# Patient Record
Sex: Male | Born: 1955
Health system: Southern US, Community
[De-identification: ages and names within clinical notes are randomized; demographics above are authoritative.]

## PROBLEM LIST (undated history)

## (undated) DIAGNOSIS — J670 Farmer's lung: Secondary | ICD-10-CM

## (undated) DIAGNOSIS — I10 Essential (primary) hypertension: Secondary | ICD-10-CM

## (undated) DIAGNOSIS — T7840XA Allergy, unspecified, initial encounter: Secondary | ICD-10-CM

## (undated) DIAGNOSIS — K219 Gastro-esophageal reflux disease without esophagitis: Secondary | ICD-10-CM

## (undated) HISTORY — PX: FRACTURE SURGERY: SHX138

## (undated) HISTORY — PX: CHOLECYSTECTOMY: SHX55

## (undated) HISTORY — PX: SPLENECTOMY: SUR1306

## (undated) HISTORY — PX: HERNIA REPAIR: SHX51

## (undated) HISTORY — PX: TONSILLECTOMY: SUR1361

## (undated) HISTORY — DX: Allergy, unspecified, initial encounter: T78.40XA

## (undated) HISTORY — DX: Farmer's lung: J67.0

## (undated) HISTORY — DX: Essential (primary) hypertension: I10

---

## 2017-04-30 LAB — HM COLONOSCOPY

## 2020-11-28 DIAGNOSIS — K219 Gastro-esophageal reflux disease without esophagitis: Secondary | ICD-10-CM | POA: Diagnosis not present

## 2020-11-28 DIAGNOSIS — N529 Male erectile dysfunction, unspecified: Secondary | ICD-10-CM | POA: Diagnosis not present

## 2020-11-28 DIAGNOSIS — Z87891 Personal history of nicotine dependence: Secondary | ICD-10-CM | POA: Diagnosis not present

## 2020-11-28 DIAGNOSIS — Z809 Family history of malignant neoplasm, unspecified: Secondary | ICD-10-CM | POA: Diagnosis not present

## 2020-11-28 DIAGNOSIS — Z823 Family history of stroke: Secondary | ICD-10-CM | POA: Diagnosis not present

## 2020-11-28 DIAGNOSIS — Z8249 Family history of ischemic heart disease and other diseases of the circulatory system: Secondary | ICD-10-CM | POA: Diagnosis not present

## 2020-11-28 DIAGNOSIS — R030 Elevated blood-pressure reading, without diagnosis of hypertension: Secondary | ICD-10-CM | POA: Diagnosis not present

## 2021-04-10 ENCOUNTER — Other Ambulatory Visit: Payer: Self-pay

## 2021-04-10 ENCOUNTER — Ambulatory Visit (INDEPENDENT_AMBULATORY_CARE_PROVIDER_SITE_OTHER): Payer: Medicare HMO | Admitting: Nurse Practitioner

## 2021-04-10 ENCOUNTER — Encounter: Payer: Self-pay | Admitting: Nurse Practitioner

## 2021-04-10 VITALS — BP 150/101 | HR 92 | Temp 98.2°F | Ht 68.0 in | Wt 181.0 lb

## 2021-04-10 DIAGNOSIS — Z9109 Other allergy status, other than to drugs and biological substances: Secondary | ICD-10-CM

## 2021-04-10 DIAGNOSIS — Z91030 Bee allergy status: Secondary | ICD-10-CM | POA: Diagnosis not present

## 2021-04-10 MED ORDER — ALBUTEROL SULFATE HFA 108 (90 BASE) MCG/ACT IN AERS: 2.0000 | INHALATION_SPRAY | RESPIRATORY_TRACT | 3 refills | Status: DC | PRN

## 2021-04-10 MED ORDER — EPINEPHRINE 0.3 MG/0.3ML IJ SOAJ: 0.3000 mg | INTRAMUSCULAR | 1 refills | Status: DC | PRN

## 2021-04-10 NOTE — Assessment & Plan Note (Signed)
-  occurred several days ago; no acute issues today -Rx. Epipen; discussed how to use it and to go to ED for eval if he has to use this

## 2021-04-10 NOTE — Progress Notes (Signed)
Acute Office Visit  Subjective:    Patient ID: Kyle Jarvis, male    DOB: 05-26-1956, 65 y.o.   MRN: [id number]  Chief Complaint  Patient presents with   Allergic Reaction    Was stung by yellow jackets x1 week ago, body started swelling all over and broke out in hives, also had shortness of breath. Would like epi-pen and needs inhaler refilled.    Allergic Reaction  Patient is in today for allergy.  He has allergies that cause throat swelling. He has allergies to fermented things like wine, animal feeds, etc. He has bene using an inhaler for that, but needs a refill.  He also had recent anaphylactic rxn to been venom, and would like epipen.  Past Medical History:  Diagnosis Date   Allergy    Hypertension     Past Surgical History:  Procedure Laterality Date   CHOLECYSTECTOMY     FRACTURE SURGERY Right    hand surgery- in [place]      History reviewed. No pertinent family history.  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   Social Determinants of Health   Financial Resource Strain: Not on file  Food Insecurity: Not on file  Transportation Needs: Not on file  Physical Activity: Not on file  Stress: Not on file  Social Connections: Not on file  Intimate Partner  Violence: Not on file    Outpatient Medications Prior to Visit  Medication Sig Dispense Refill   albuterol (VENTOLIN HFA) 108 (90 Base) MCG/ACT inhaler Inhale 2 puffs into the lungs every 4 (four) hours as needed for wheezing or shortness of breath.     No facility-administered medications prior to visit.    Allergies  Allergen Reactions   Bee Venom Anaphylaxis   Hydrocortisone Swelling    Had back injection, and he had to be wheelchair bound for 3 months   Pollen Extract     Review of Systems  Constitutional: Negative.   Respiratory: Negative.    Cardiovascular: Negative.   Musculoskeletal: Negative.   Allergic/Immunologic: Positive for environmental allergies.  Psychiatric/Behavioral: Negative.        Objective:    Physical Exam Constitutional:      Appearance: Normal appearance.  Cardiovascular:     Rate and Rhythm: Normal rate and regular rhythm.     Pulses: Normal pulses.     Heart sounds: Normal heart sounds.  Pulmonary:     Effort: Pulmonary effort is normal.     Breath sounds: Normal breath sounds.  Neurological:     Mental Status: He is alert.  Psychiatric:  Mood and Affect: Mood normal.        Behavior: Behavior normal.        Thought Content: Thought content normal.        Judgment: Judgment normal.    BP (!) 150/101 (BP Location: Left Arm, Patient Position: Sitting, Cuff Size: Large)   Pulse 92   Temp 98.2 F (36.8 C) (Temporal)   Ht '5\' 8"'  (1.727 m)   Wt 181 lb (82.1 kg)   SpO2 95%   BMI 27.52 kg/m  Wt Readings from Last 3 Encounters:  04/10/21 181 lb (82.1 kg)    Health Maintenance Due  Topic Date Due   Hepatitis C Screening  Never done   COLONOSCOPY (Pts 45-54yr Insurance coverage will need to be confirmed)  Never done   Zoster Vaccines- Shingrix (1 of 2) Never done   PNA vac Low Risk Adult (1 of 2 - PCV13) Never done   INFLUENZA VACCINE  04/01/2021    There are no preventive care reminders to display for this patient.   No  results found for: TSH No results found for: WBC, HGB, HCT, MCV, PLT No results found for: NA, K, CHLORIDE, CO2, GLUCOSE, BUN, CREATININE, BILITOT, ALKPHOS, AST, ALT, PROT, ALBUMIN, CALCIUM, ANIONGAP, EGFR, GFR No results found for: CHOL No results found for: HDL No results found for: LDLCALC No results found for: TRIG No results found for: CHOLHDL No results found for: HGBA1C     Assessment & Plan:   Problem List Items Addressed This Visit       Other   Allergy to bee sting - Primary    -occurred several days ago; no acute issues today -Rx. Epipen; discussed how to use it and to go to ED for eval if he has to use this       Environmental allergies    -has pollen allergies and allergies to "fermented things"- like animal feeds, wine, etc. -causes SOB, and he uses albuterol for this -Refilled albuterol       Relevant Medications   albuterol (VENTOLIN HFA) 108 (90 Base) MCG/ACT inhaler     Meds ordered this encounter  Medications   albuterol (VENTOLIN HFA) 108 (90 Base) MCG/ACT inhaler    Sig: Inhale 2 puffs into the lungs every 4 (four) hours as needed for wheezing or shortness of breath.    Dispense:  6.7 g    Refill:  3   EPINEPHrine 0.3 mg/0.3 mL IJ SOAJ injection    Sig: Inject 0.3 mg into the muscle as needed for anaphylaxis.    Dispense:  1 each    Refill:  1[place]quott NP

## 2021-04-10 NOTE — Assessment & Plan Note (Signed)
-  has pollen allergies and allergies to "fermented things"- like animal feeds, wine, etc. -causes SOB, and he uses albuterol for this -Refilled albuterol

## 2021-04-26 ENCOUNTER — Telehealth (INDEPENDENT_AMBULATORY_CARE_PROVIDER_SITE_OTHER): Payer: Medicare HMO | Admitting: Nurse Practitioner

## 2021-04-26 ENCOUNTER — Encounter: Payer: Self-pay | Admitting: Nurse Practitioner

## 2021-04-26 ENCOUNTER — Other Ambulatory Visit: Payer: Self-pay

## 2021-04-26 ENCOUNTER — Telehealth: Payer: Self-pay

## 2021-04-26 DIAGNOSIS — U071 COVID-19: Secondary | ICD-10-CM | POA: Insufficient documentation

## 2021-04-26 HISTORY — DX: COVID-19: U07.1

## 2021-04-26 MED ORDER — NOREL AD 4-10-325 MG PO TABS: 1.0000 | ORAL_TABLET | ORAL | 1 refills | Status: DC | PRN

## 2021-04-26 MED ORDER — NIRMATRELVIR/RITONAVIR (PAXLOVID)TABLET: 3.0000 | ORAL_TABLET | Freq: Two times a day (BID) | ORAL | 0 refills | Status: AC

## 2021-04-26 MED ORDER — HYDROCOD POLST-CPM POLST ER 10-8 MG/5ML PO SUER: 5.0000 mL | Freq: Two times a day (BID) | ORAL | 0 refills | Status: DC | PRN

## 2021-04-26 MED ORDER — PREDNISONE 20 MG PO TABS: 20.0000 mg | ORAL_TABLET | Freq: Every day | ORAL | 0 refills | Status: DC

## 2021-04-26 NOTE — Telephone Encounter (Signed)
Olegario Messier from [facility] called and is requesting to speak with someone about the medications sent in today it has severe reactions with other medications and she is needing a complete med list ph# [phone]

## 2021-04-26 NOTE — Progress Notes (Addendum)
Acute Office Visit  Subjective:    Patient ID: Kyle Jarvis, male    DOB: [birth year], 65 y.o.   MRN: [id number]  Chief Complaint  Patient presents with   Covid Positive    Tested positive on an at home test this morning. Symptoms started yesterday, sinus congestion, productive cough, and fever.    HPI Patient is in today for COVID-19. Symptoms per ROS. No OTCs at this point.   Past Medical History:  Diagnosis Date   Allergy    Hypertension     Past Surgical History:  Procedure Laterality Date   CHOLECYSTECTOMY     FRACTURE SURGERY Right    hand surgery- in [place]      History reviewed. No pertinent family history.  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   Social Determinants of Health   Financial Resource Strain: Not on file  Food Insecurity: Not on file  Transportation Needs: Not on file  Physical Activity: Not on file  Stress: Not on file  Social Connections: Not on file  Intimate Partner Violence: Not on file    Outpatient Medications Prior to Visit  Medication Sig Dispense Refill   albuterol (VENTOLIN HFA) 108 (90 Base) MCG/ACT inhaler Inhale 2 puffs into the lungs every 4 (four) hours as needed for wheezing or shortness of breath. 6.7 g 3   EPINEPHrine  0.3 mg/0.3 mL IJ SOAJ injection Inject 0.3 mg into the muscle as needed for anaphylaxis. 1 each 1   No facility-administered medications prior to visit.    Allergies  Allergen Reactions   Bee Venom Anaphylaxis   Hydrocortisone Swelling    Had back injection, and he had to be wheelchair bound for 3 months   Pollen Extract     Review of Systems  Constitutional:  Positive for fever.  HENT:  Positive for congestion.   Respiratory:  Positive for cough and shortness of breath.       Objective:    Physical Exam  Ht '5\' 8"'  (1.727 m)   Wt 180 lb (81.6 kg)   BMI 27.37 kg/m  Wt Readings from Last 3 Encounters:  04/26/21 180 lb (81.6 kg)  04/10/21 181 lb (82.1 kg)    Health Maintenance Due  Topic Date Due   Hepatitis C Screening  Never done   COLONOSCOPY (Pts 45-2yr Insurance coverage will need to be confirmed)  Never done   Zoster Vaccines- Shingrix (1 of 2) Never done   PNA vac Low Risk Adult (1 of 2 - PCV13) Never done   INFLUENZA VACCINE  04/01/2021    There are no preventive care reminders  to display for this patient.   No results found for: TSH No results found for: WBC, HGB, HCT, MCV, PLT No results found for: NA, K, CHLORIDE, CO2, GLUCOSE, BUN, CREATININE, BILITOT, ALKPHOS, AST, ALT, PROT, ALBUMIN, CALCIUM, ANIONGAP, EGFR, GFR No results found for: CHOL No results found for: HDL No results found for: LDLCALC No results found for: TRIG No results found for: CHOLHDL No results found for: HGBA1C     Assessment & Plan:   Problem List Items Addressed This Visit       Other   COVID-19    -he states he has hx of [facility] -Rx. Paxlovid -Rx prednisone burst pack; we discussed taking half of a tablet today as a test dose since he had allergy to hydrocortisone; if he has no issues, he can take a full dose tomorrow, and just save the half tablet until  -Rx. tussionex and norel      Relevant Medications   nirmatrelvir/ritonavir EUA (PAXLOVID) 20 x 150 MG & 10  x 100MG TABS     Meds ordered this encounter  Medications   chlorpheniramine-HYDROcodone (TUSSIONEX PENNKINETIC ER) 10-8 MG/5ML SUER    Sig: Take 5 mLs by mouth every 12 (twelve) hours as needed for cough.    Dispense:  115 mL    Refill:  0   predniSONE (DELTASONE) 20 MG tablet    Sig: Take 1 tablet (20 mg total) by mouth daily with breakfast.    Dispense:  10 tablet    Refill:  0   Chlorphen-PE-Acetaminophen (NOREL AD) 4-10-325 MG TABS    Sig: Take 1 tablet by mouth every 4 (four) hours as needed (nasal congestion, cold symptoms).    Dispense:  20 tablet    Refill:  1   nirmatrelvir/ritonavir EUA (PAXLOVID) 20 x 150 MG & 10 x 100MG TABS    Sig: Take 3 tablets by mouth 2 (two) times daily for 5 days. (Take nirmatrelvir 150 mg two tablets twice daily for 5 days and ritonavir 100 mg one tablet twice daily for 5 days) Patient GFR is unknown.    Dispense:  30 tablet    Refill:  0   Date:  04/26/2021   Location of Patient: Home Location of Provider: Office Consent was obtain for visit to be over via telehealth. I verified that I am speaking with the correct person using two identifiers.  I connected with  Kyle Jarvis on 04/26/21 via telephone and verified that I am speaking with the correct person using two identifiers.   I discussed the limitations of evaluation and management by telemedicine. The patient expressed understanding and agreed to proceed.  Time spent: 7 minutes   Noreene Larsson, NP

## 2021-04-26 NOTE — Assessment & Plan Note (Addendum)
-  he states he has hx of [facility] Lung -Rx. Paxlovid -Rx prednisone burst pack; we discussed taking half of a tablet today as a test dose since he had allergy to hydrocortisone; if he has no issues, he can take a full dose tomorrow, and just save the half tablet until  -Rx. tussionex and norel

## 2021-04-26 NOTE — Telephone Encounter (Signed)
Spoke with Olegario Messier, she needed a med list faxed over. Faxed.

## 2021-05-15 ENCOUNTER — Ambulatory Visit (INDEPENDENT_AMBULATORY_CARE_PROVIDER_SITE_OTHER): Payer: Medicare HMO | Admitting: Nurse Practitioner

## 2021-05-15 ENCOUNTER — Encounter: Payer: Self-pay | Admitting: Nurse Practitioner

## 2021-05-15 ENCOUNTER — Other Ambulatory Visit: Payer: Self-pay

## 2021-05-15 VITALS — BP 149/99 | HR 94 | Temp 98.1°F | Ht 68.0 in | Wt 178.0 lb

## 2021-05-15 DIAGNOSIS — N5239 Other post-surgical erectile dysfunction: Secondary | ICD-10-CM | POA: Diagnosis not present

## 2021-05-15 DIAGNOSIS — Z7689 Persons encountering health services in other specified circumstances: Secondary | ICD-10-CM | POA: Diagnosis not present

## 2021-05-15 DIAGNOSIS — Z139 Encounter for screening, unspecified: Secondary | ICD-10-CM | POA: Diagnosis not present

## 2021-05-15 DIAGNOSIS — Z9109 Other allergy status, other than to drugs and biological substances: Secondary | ICD-10-CM

## 2021-05-15 DIAGNOSIS — Z23 Encounter for immunization: Secondary | ICD-10-CM

## 2021-05-15 DIAGNOSIS — N529 Male erectile dysfunction, unspecified: Secondary | ICD-10-CM | POA: Insufficient documentation

## 2021-05-15 NOTE — Progress Notes (Signed)
New Patient Office Visit  Subjective:  Patient ID: Kyle Jarvis, male    DOB: 12/03/1955  Age: 65 y.o. MRN: [id number]  CC:  Chief Complaint  Patient presents with   New Patient (Initial Visit)    Here to establish care. No complaints today.    HPI Kyle Jarvis presents for new patient visit. Transferring care from Dr. Callie Fielding in [place], [place]. Last physical was over 2 years ago. Last labs were done at that time.  He had COVID 2 weeks ago.  No acute concerns.  Past Medical History:  Diagnosis Date   Allergy    COVID-19 04/26/2021   Far[facility])    Hypertension     Past Surgical History:  Procedure Laterality Date   CHOLECYSTECTOMY     FRACTURE SURGERY Right    hand surgery- in [place]      History reviewed. No pertinent family history.  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, forensic, Air cabin crew  Tobacco Use   Smoking status: Former    Packs/day: 1.00    Years: 9.00    Pack years: 9.00    Types: Cigarettes   Smokeless tobacco: Former    Types: Chew    Quit date: 05/15/2016  Vaping Use   Vaping Use: Never used  Substance and Sexual Activity   Alcohol use: Yes    Alcohol/week: 28.0 standard drinks    Types: 14 Cans of beer, 14 Shots of liquor per week    Comment: trying to cut back   Drug use: Not Currently   Sexual activity: Not Currently    Comment: erectile dysfunction- since hernia surgery  Other Topics Concern   Not on file  Social History Narrative   Moved to [place] 2 years ago to be closer to daughter.   Social Determinants of Health   Financial Resource Strain: Not on file  Food Insecurity: Not on file  Transportation Needs: Not on file  Physical  Activity: Not on file  Stress: Not on file  Social Connections: Not on file  Intimate Partner Violence: Not on file    ROS Review of Systems  Constitutional: Negative.   Respiratory: Negative.    Cardiovascular: Negative.   Musculoskeletal: Negative.   Psychiatric/Behavioral: Negative.     Objective:   Today's Vitals: BP (!) 149/99 (BP Location: Left Arm, Patient Position: Sitting, Cuff Size: Large)   Pulse 94   Temp 98.1 F (36.7 C) (Oral)   Ht _0  (1.727 m)   Wt 178 lb (80.7 kg)   SpO2 95%   BMI 27.06 kg/m   Physical Exam Constitutional:      Appearance: Normal appearance.  Cardiovascular:     Rate and Rhythm: Normal rate and regular rhythm.     Pulses: Normal pulses.     Heart sounds: Normal heart sounds.  Pulmonary:     Effort: Pulmonary effort is normal.     Breath sounds: Normal breath sounds.  Musculoskeletal:        General: Normal range of motion.  Neurological:     Mental Status: He is alert.  Psychiatric:        Mood and Affect:  Mood normal.        Behavior: Behavior normal.        Thought Content: Thought content normal.        Judgment: Judgment normal.    Assessment & Plan:   Problem List Items Addressed This Visit       Other   Environmental allergies    Uses albuterol PRN      Encounter to establish care - Primary    -obtain records from previous PCP and GI colonoscopy results      Relevant Orders   Hepatitis C Antibody   CMP14+EGFR   CBC with Differential/Platelet   Lipid Panel With LDL/HDL Ratio   Erectile dysfunction    -states he has had ED since hernia repair      Relevant Orders   Ambulatory referral to Urology   Immunization due    -Prevnar-20 administered today      Other Visit Diagnoses     Screening due       Relevant Orders   Hepatitis C Antibody       Outpatient Encounter Medications as of 05/15/2021  Medication Sig   albuterol (VENTOLIN HFA) 108 (90 Base) MCG/ACT inhaler Inhale 2 puffs into the lungs  every 4 (four) hours as needed for wheezing or shortness of breath.   EPINEPHrine 0.3 mg/0.3 mL IJ SOAJ injection Inject 0.3 mg into the muscle as needed for anaphylaxis.   [DISCONTINUED] Chlorphen-PE-Acetaminophen (NOREL AD) 4-10-325 MG TABS Take 1 tablet by mouth every 4 (four) hours as needed (nasal congestion, cold symptoms). (Patient not taking: Reported on 05/15/2021)   [DISCONTINUED] chlorpheniramine-HYDROcodone (TUSSIONEX PENNKINETIC ER) 10-8 MG/5ML SUER Take 5 mLs by mouth every 12 (twelve) hours as needed for cough. (Patient not taking: Reported on 05/15/2021)   [DISCONTINUED] predniSONE (DELTASONE) 20 MG tablet Take 1 tablet (20 mg total) by mouth daily with breakfast. (Patient not taking: Reported on 05/15/2021)   No facility-administered encounter medications on file as of 05/15/2021.    Follow-up: Return in about 2 months (around 07/15/2021) for Physical Exam.   Noreene Larsson, NP

## 2021-05-15 NOTE — Assessment & Plan Note (Signed)
Uses albuterol PRN

## 2021-05-15 NOTE — Addendum Note (Signed)
Addended by: Jerilynn Mages on: 05/15/2021 11:39 AM   Modules accepted: Orders

## 2021-05-15 NOTE — Assessment & Plan Note (Signed)
Prevnar 20 administered today. 

## 2021-05-15 NOTE — Assessment & Plan Note (Addendum)
-  obtain records from previous PCP and GI colonoscopy results

## 2021-05-15 NOTE — Assessment & Plan Note (Signed)
-  states he has had ED since hernia repair

## 2021-05-15 NOTE — Patient Instructions (Signed)
Please have fasting labs drawn 2-3 days prior to your appointment so we can discuss the results during your office visit.  

## 2021-06-19 ENCOUNTER — Other Ambulatory Visit: Payer: Self-pay

## 2021-06-19 ENCOUNTER — Ambulatory Visit (INDEPENDENT_AMBULATORY_CARE_PROVIDER_SITE_OTHER): Payer: Medicare HMO | Admitting: Internal Medicine

## 2021-06-19 ENCOUNTER — Encounter: Payer: Self-pay | Admitting: Internal Medicine

## 2021-06-19 DIAGNOSIS — J069 Acute upper respiratory infection, unspecified: Secondary | ICD-10-CM | POA: Diagnosis not present

## 2021-06-19 NOTE — Progress Notes (Signed)
Virtual Visit via Telephone Note   This visit type was conducted due to national recommendations for restrictions regarding the COVID-19 Pandemic (e.g. social distancing) in an effort to limit this patient's exposure and mitigate transmission in our community.  Due to his co-morbid illnesses, this patient is at least at moderate risk for complications without adequate follow up.  This format is felt to be most appropriate for this patient at this time.  The patient did not have access to video technology/had technical difficulties with video requiring transitioning to audio format only (telephone).  All issues noted in this document were discussed and addressed.  No physical exam could be performed with this format.  Evaluation Performed:  Follow-up visit  Date:  06/19/2021   ID:  Kyle Jarvis, Kyle Jarvis 1956-04-10, MRN [id number]  Patient Location: Home Provider Location: Office/Clinic  Participants: Patient Location of Patient: Home Location of Provider: Telehealth Consent was obtain for visit to be over via telehealth. I verified that I am speaking with the correct person using two identifiers.  PCP:  Heather Roberts, NP   Chief Complaint:  Cough, chest tightness and fatigue  History of Present Illness:    Kyle Jarvis is a 65 y.o. male who has a televisit for c/o cough, chest tightness and fatigue yesterday. Denies any fever, chills, sore throat, dyspnea or wheezing. He has tried Robitussin and his symptoms have improved today. He has Albuterol for dyspnea as well.  The patient does not have symptoms concerning for COVID-19 infection (fever, chills, cough, or new shortness of breath).   Past Medical, Surgical, Social History, Allergies, and Medications have been Reviewed.  Past Medical History:  Diagnosis Date   Allergy    COVID-19 04/26/2021   Farmer's lung [facility])    Hypertension    Past Surgical History:  Procedure Laterality Date   CHOLECYSTECTOMY      FRACTURE SURGERY Right    hand surgery- in [place]   HERNIA REPAIR     SPLENECTOMY       Current Meds  Medication Sig   albuterol (VENTOLIN HFA) 108 (90 Base) MCG/ACT inhaler Inhale 2 puffs into the lungs every 4 (four) hours as needed for wheezing or shortness of breath.   EPINEPHrine 0.3 mg/0.3 mL IJ SOAJ injection Inject 0.3 mg into the muscle as needed for anaphylaxis.     Allergies:   Bee venom, Hydrocortisone, and Pollen extract   ROS:   Please see the history of present illness.     All other systems reviewed and are negative.   Labs/Other Tests and Data Reviewed:    Recent Labs: No results found for requested labs within last 8760 hours.   Recent Lipid Panel No results found for: CHOL, TRIG, HDL, CHOLHDL, LDLCALC, LDLDIRECT  Wt Readings from Last 3 Encounters:  05/15/21 178 lb (80.7 kg)  04/26/21 180 lb (81.6 kg)  04/10/21 181 lb (82.1 kg)     ASSESSMENT & PLAN:    URTI Cough and congestion better with symptomatic treatment If persistent, may need antibiotic and steroid if dyspnea or wheezing Albuterol PRN for dyspnea/wheezing  Time:   Today, I have spent 6 minutes reviewing the chart, including problem list, medications, and with the patient with telehealth technology discussing the above problems.   Medication Adjustments/Labs and Tests Ordered: Current medicines are reviewed at length with the patient today.  Concerns regarding medicines are outlined above.   Tests Ordered: No orders of the defined types were placed in this  encounter.   Medication Changes: No orders of the defined types were placed in this encounter.    Note: This dictation was prepared with Dragon dictation along with smaller phrase technology. Similar sounding words can be transcribed inadequately or may not be corrected upon review. Any transcriptional errors that result from this process are unintentional.      Disposition:  Follow up  Signed, Anabel Halon, MD   06/19/2021 11:58 AM     [name]

## 2021-06-24 ENCOUNTER — Ambulatory Visit (INDEPENDENT_AMBULATORY_CARE_PROVIDER_SITE_OTHER): Payer: Medicare HMO

## 2021-06-24 ENCOUNTER — Other Ambulatory Visit: Payer: Self-pay

## 2021-06-24 VITALS — HR 108 | Ht 68.0 in | Wt 179.0 lb

## 2021-06-24 DIAGNOSIS — Z0000 Encounter for general adult medical examination without abnormal findings: Secondary | ICD-10-CM | POA: Diagnosis not present

## 2021-06-24 NOTE — Progress Notes (Signed)
Subjective:   Kyle Jarvis is a 65 y.o. male who presents for an Initial Medicare Annual Wellness Visit.  Review of Systems     Cardiac Risk Factors include: advanced age (>26men, >24 women);male gender     Objective:    Today's Vitals   06/24/21 1501 06/24/21 1512  Pulse: (!) 108   SpO2: 96%   Weight: 179 lb (81.2 kg)   Height: 5\' 8"  (1.727 m)   PainSc:  0-No pain   Body mass index is 27.22 kg/m.  Advanced Directives 06/24/2021  Does Patient Have a Medical Advance Directive? No  Would patient like information on creating a medical advance directive? No - Patient declined    Current Medications (verified) Outpatient Encounter Medications as of 06/24/2021  Medication Sig   albuterol (VENTOLIN HFA) 108 (90 Base) MCG/ACT inhaler Inhale 2 puffs into the lungs every 4 (four) hours as needed for wheezing or shortness of breath.   EPINEPHrine 0.3 mg/0.3 mL IJ SOAJ injection Inject 0.3 mg into the muscle as needed for anaphylaxis.   No facility-administered encounter medications on file as of 06/24/2021.    Allergies (verified) Bee venom, Hydrocortisone, and Pollen extract   History: Past Medical History:  Diagnosis Date   Allergy    COVID-19 04/26/2021   Farmer's lung (HCC)    Hypertension    Past Surgical History:  Procedure Laterality Date   CHOLECYSTECTOMY     FRACTURE SURGERY Right    hand surgery- in 04/28/2021   HERNIA REPAIR     SPLENECTOMY     History reviewed. No pertinent family history. 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[employer], Engineer, maintenance  Tobacco Use   Smoking status: Former    Packs/day: 1.00    Years: 9.00    Pack years: 9.00    Types: Cigarettes   Smokeless tobacco: Former    Types: Chew    Quit  date: 05/15/2016  Vaping Use   Vaping Use: Never used  Substance and Sexual Activity   Alcohol use: Yes    Alcohol/week: 28.0 standard drinks    Types: 14 Cans of beer, 14 Shots of liquor per week    Comment: trying to cut back   Drug use: Not Currently   Sexual activity: Not Currently    Comment: erectile dysfunction- since hernia surgery  Other Topics Concern   Not on file  Social History Narrative   Moved to [place] 2 years ago to be closer to daughter.   Social Determinants of Health   Financial Resource Strain: Low Risk    Difficulty of Paying Living Expenses: Not hard at all  Food Insecurity: No Food Insecurity   Worried About [place] in the Last Year: Never true   Ran Out of Food in the Last Year: Never true  Transportation Needs: No Transportation Needs   Lack of Transportation (Medical): No   Lack of Transportation (Non-Medical): No  Physical Activity: Sufficiently Active   Days of Exercise per Week: 5 days   Minutes of Exercise per Session: 60 min  Stress: No Stress Concern Present   Feeling of Stress : Not at all  Social Connections: Socially Integrated   Frequency of Communication with Friends  and Family: More than three times a week   Frequency of Social Gatherings with Friends and Family: More than three times a week   Attends Religious Services: More than 4 times per year   Active Member of [employer] or Organizations: Yes   Attends Engineer, structural: More than 4 times per year   Marital Status: Married    Tobacco Counseling Counseling given: Not Answered   Clinical Intake:  Pre-visit preparation completed: Yes  Pain : No/denies pain Pain Score: 0-No pain     BMI - recorded: 27.22 Nutritional Status: BMI 25 -29 Overweight Nutritional Risks: None Diabetes: No  How often do you need to have someone help you when you read instructions, pamphlets, or other written materials from your doctor or pharmacy?: 1 -  Never  Diabetic?No  Interpreter Needed?: No  Information entered by :: MJ , LPN   Activities of Daily Living In your present state of health, do you have any difficulty performing the following activities: 06/24/2021 06/19/2021  Hearing? N N  Vision? N N  Difficulty concentrating or making decisions? Y N  Comment At times. -  Walking or climbing stairs? N N  Dressing or bathing? N N  Doing errands, shopping? N N  Preparing Food and eating ? N -  Using the Toilet? N -  In the past six months, have you accidently leaked urine? N -  Do you have problems with loss of bowel control? N -  Managing your Medications? N -  Managing your Finances? N -  Housekeeping or managing your Housekeeping? N -    Patient Care Team: Heather Roberts, NP as PCP - General (Nurse Practitioner)  Indicate any recent Medical Services you may have received from other than [facility] providers in the past year (date may be approximate).     Assessment:   This is a routine wellness examination for [place].  Hearing/Vision screen Hearing Screening - Comments:: Some hearing issues. Vision Screening - Comments:: Glasses. [facility] week.   Dietary issues and exercise activities discussed: Current Exercise Habits: The patient has a physically strenuous job, but has no regular exercise apart from work., Exercise limited by: orthopedic condition(s)   Goals Addressed   None    Depression Screen PHQ 2/9 Scores 06/24/2021 06/19/2021 05/15/2021 04/26/2021 04/10/2021  PHQ - 2 Score 0 0 0 0 0    Fall Risk Fall Risk  06/24/2021 06/19/2021 05/15/2021 04/26/2021 04/10/2021  Falls in the past year? 1 0 1 1 1   Number falls in past yr: 0 0 0 1 1  Injury with Fall? 0 0 0 0 0  Risk for fall due to : History of fall(s);Impaired vision No Fall Risks Impaired balance/gait Impaired balance/gait Orthopedic patient  Follow up Follow up appointment Falls evaluation completed Falls evaluation completed Falls  evaluation completed Falls evaluation completed    FALL RISK PREVENTION PERTAINING TO THE HOME:  Any stairs in or around the home? No  If so, are there any without handrails? No  Home free of loose throw rugs in walkways, pet beds, electrical cords, etc? Yes  Adequate lighting in your home to reduce risk of falls? Yes   ASSISTIVE DEVICES UTILIZED TO PREVENT FALLS:  Life alert? No  Use of a cane, walker or w/c? No  Grab bars in the bathroom? No  Shower chair or bench in shower? No  Elevated toilet seat or a handicapped toilet? No   TIMED UP AND GO:  Was the  test performed? Yes .  Length of time to ambulate 10 feet: 7 sec.   Gait steady and fast without use of assistive device  Cognitive Function:     6CIT Screen 06/24/2021  What Year? 0 points  What month? 0 points  What time? 0 points  Count back from 20 0 points  Months in reverse 2 points  Repeat phrase 0 points  Total Score 2    Immunizations Immunization History  Administered Date(s) Administered   PNEUMOCOCCAL CONJUGATE-20 05/15/2021    TDAP status: Up to date  Flu Vaccine status: Declined, Education has been provided regarding the importance of this vaccine but patient still declined. Advised may receive this vaccine at local pharmacy or Health Dept. Aware to provide a copy of the vaccination record if obtained from local pharmacy or Health Dept. Verbalized acceptance and understanding.  Pneumococcal vaccine status: Due, Education has been provided regarding the importance of this vaccine. Advised may receive this vaccine at local pharmacy or Health Dept. Aware to provide a copy of the vaccination record if obtained from local pharmacy or Health Dept. Verbalized acceptance and understanding.  Covid-19 vaccine status: Information provided on how to obtain vaccines.   Qualifies for Shingles Vaccine? Yes   Zostavax completed Yes   Shingrix Completed?: Yes  Screening Tests Health Maintenance  Topic Date Due    COVID-19 Vaccine (1) Never done   Hepatitis C Screening  Never done   COLONOSCOPY (Pts 45-37yrs Insurance coverage will need to be confirmed)  Never done   Zoster Vaccines- Shingrix (1 of 2) Never done   INFLUENZA VACCINE  11/29/2021 (Originally 04/01/2021)   TETANUS/TDAP  04/10/2022 (Originally 12/28/1974)   Pneumonia Vaccine 1+ Years old  Completed   HPV VACCINES  Aged Out    Health Maintenance  Health Maintenance Due  Topic Date Due   COVID-19 Vaccine (1) Never done   Hepatitis C Screening  Never done   COLONOSCOPY (Pts 45-68yrs Insurance coverage will need to be confirmed)  Never done   Zoster Vaccines- Shingrix (1 of 2) Never done    Colorectal cancer screening: Type of screening: Colonoscopy. Completed 2019. Repeat every 10 years  Lung Cancer Screening: (Low Dose CT Chest recommended if Age 3-80 years, 30 pack-year currently smoking OR have quit w/in 15years.) does not qualify.    Additional Screening:  Hepatitis C Screening: does qualify; Completed Not done  Vision Screening: Recommended annual ophthalmology exams for early detection of glaucoma and other disorders of the eye. Is the patient up to date with their annual eye exam?  Yes  Who is the provider or what is the name of the office in which the patient attends annual eye exams? [facility] If pt is not established with a provider, would they like to be referred to a provider to establish care? No .   Dental Screening: Recommended annual dental exams for proper oral hygiene  Community Resource Referral / Chronic Care Management: CRR required this visit?  No   CCM required this visit?  No      Plan:     I have personally reviewed and noted the following in the patient's chart:   Medical and social history Use of alcohol, tobacco or illicit drugs  Current medications and supplements including opioid prescriptions. Patient is not currently taking opioid prescriptions. Functional ability and  status Nutritional status Physical activity Advanced directives List of other physicians Hospitalizations, surgeries, and ER visits in previous 12 months Vitals Screenings to include  cognitive, depression, and falls Referrals and appointments  In addition, I have reviewed and discussed with patient certain preventive protocols, quality metrics, and best practice recommendations. A written personalized care plan for preventive services as well as general preventive health recommendations were provided to patient.     Darral Dash, LPN   80/32/1224   Nurse Notes: Pt states he is doing well. Initial BP was 163/110 with automatic cuff. Rechecked at end of visit and BP was 158/102. Pt denies headache, dizziness or blurred vision. Dr. Allena Katz advised of BP results and asked for pt to have an appointment in the next 2 weeks with him. Appointment made for 07/08/2021 @ 3:40 pm. Pt advised of appt date and time and for what symptoms to be on the lookout for. Pt verbalized understanding of all.

## 2021-06-24 NOTE — Patient Instructions (Signed)
Kyle Jarvis , Thank you for taking time to come for your Medicare Wellness Visit. I appreciate your ongoing commitment to your health goals. Please review the following plan we discussed and let me know if I can assist you in the future.   Screening recommendations/referrals: Colonoscopy: Done 2019. Repeat in 10 years  Recommended yearly ophthalmology/optometry visit for glaucoma screening and checkup Recommended yearly dental visit for hygiene and checkup  Vaccinations: Influenza vaccine: Declined. Pneumococcal vaccine: Done 05/15/2021 Tdap vaccine: Done 2019 Repeat in 10 years  Shingles vaccine: Shingrix discussed. Please contact your pharmacy for coverage information.     Covid-19: Done 11/23/2019, 12/19/2019, 09/04/2020  Advanced directives: Advance directive discussed with you today. Even though you declined this today, please call our office should you change your mind, and we can give you the proper paperwork for you to fill out.   Conditions/risks identified: KEEP UP THE GOOD WORK!!! Monitor BP, please.  Next appointment: Follow up in one year for your annual wellness visit. 2023  Preventive Care 65 Years and Older, Male  Preventive care refers to lifestyle choices and visits with your health care provider that can promote health and wellness. What does preventive care include? A yearly physical exam. This is also called an annual well check. Dental exams once or twice a year. Routine eye exams. Ask your health care provider how often you should have your eyes checked. Personal lifestyle choices, including: Daily care of your teeth and gums. Regular physical activity. Eating a healthy diet. Avoiding tobacco and drug use. Limiting alcohol use. Practicing safe sex. Taking low doses of aspirin every day. Taking vitamin and mineral supplements as recommended by your health care provider. What happens during an annual well check? The services and screenings done by your  health care provider during your annual well check will depend on your age, overall health, lifestyle risk factors, and family history of disease. Counseling  Your health care provider may ask you questions about your: Alcohol use. Tobacco use. Drug use. Emotional well-being. Home and relationship well-being. Sexual activity. Eating habits. History of falls. Memory and ability to understand (cognition). Work and work Astronomer. Screening  You may have the following tests or measurements: Height, weight, and BMI. Blood pressure. Lipid and cholesterol levels. These may be checked every 5 years, or more frequently if you are over 70 years old. Skin check. Lung cancer screening. You may have this screening every year starting at age 36 if you have a 30-pack-year history of smoking and currently smoke or have quit within the past 15 years. Fecal occult blood test (FOBT) of the stool. You may have this test every year starting at age 26. Flexible sigmoidoscopy or colonoscopy. You may have a sigmoidoscopy every 5 years or a colonoscopy every 10 years starting at age 35. Prostate cancer screening. Recommendations will vary depending on your family history and other risks. Hepatitis C blood test. Hepatitis B blood test. Sexually transmitted disease (STD) testing. Diabetes screening. This is done by checking your blood sugar (glucose) after you have not eaten for a while (fasting). You may have this done every 1-3 years. Abdominal aortic aneurysm (AAA) screening. You may need this if you are a current or former smoker. Osteoporosis. You may be screened starting at age 63 if you are at high risk. Talk with your health care provider about your test results, treatment options, and if necessary, the need for more tests. Vaccines  Your health care provider may recommend certain vaccines, such as:  Influenza vaccine. This is recommended every year. Tetanus, diphtheria, and acellular pertussis  (Tdap, Td) vaccine. You may need a Td booster every 10 years. Zoster vaccine. You may need this after age 14. Pneumococcal 13-valent conjugate (PCV13) vaccine. One dose is recommended after age 14. Pneumococcal polysaccharide (PPSV23) vaccine. One dose is recommended after age 39. Talk to your health care provider about which screenings and vaccines you need and how often you need them. This information is not intended to replace advice given to you by your health care provider. Make sure you discuss any questions you have with your health care provider. Document Released: 09/14/2015 Document Revised: 05/07/2016 Document Reviewed: 06/19/2015 Elsevier Interactive Patient Education  2017 [employer] Prevention in the Home Falls can cause injuries. They can happen to people of all ages. There are many things you can do to make your home safe and to help prevent falls. What can I do on the outside of my home? Regularly fix the edges of walkways and driveways and fix any cracks. Remove anything that might make you trip as you walk through a door, such as a raised step or threshold. Trim any bushes or trees on the path to your home. Use bright outdoor lighting. Clear any walking paths of anything that might make someone trip, such as rocks or tools. Regularly check to see if handrails are loose or broken. Make sure that both sides of any steps have handrails. Any raised decks and porches should have guardrails on the edges. Have any leaves, snow, or ice cleared regularly. Use sand or salt on walking paths during winter. Clean up any spills in your garage right away. This includes oil or grease spills. What can I do in the bathroom? Use night lights. Install grab bars by the toilet and in the tub and shower. Do not use towel bars as grab bars. Use non-skid mats or decals in the tub or shower. If you need to sit down in the shower, use a plastic, non-slip stool. Keep the floor dry. Clean  up any water that spills on the floor as soon as it happens. Remove soap buildup in the tub or shower regularly. Attach bath mats securely with double-sided non-slip rug tape. Do not have throw rugs and other things on the floor that can make you trip. What can I do in the bedroom? Use night lights. Make sure that you have a light by your bed that is easy to reach. Do not use any sheets or blankets that are too big for your bed. They should not hang down onto the floor. Have a firm chair that has side arms. You can use this for support while you get dressed. Do not have throw rugs and other things on the floor that can make you trip. What can I do in the kitchen? Clean up any spills right away. Avoid walking on wet floors. Keep items that you use a lot in easy-to-reach places. If you need to reach something above you, use a strong step stool that has a grab bar. Keep electrical cords out of the way. Do not use floor polish or wax that makes floors slippery. If you must use wax, use non-skid floor wax. Do not have throw rugs and other things on the floor that can make you trip. What can I do with my stairs? Do not leave any items on the stairs. Make sure that there are handrails on both sides of the stairs and use them.  Fix handrails that are broken or loose. Make sure that handrails are as long as the stairways. Check any carpeting to make sure that it is firmly attached to the stairs. Fix any carpet that is loose or worn. Avoid having throw rugs at the top or bottom of the stairs. If you do have throw rugs, attach them to the floor with carpet tape. Make sure that you have a light switch at the top of the stairs and the bottom of the stairs. If you do not have them, ask someone to add them for you. What else can I do to help prevent falls? Wear shoes that: Do not have high heels. Have rubber bottoms. Are comfortable and fit you well. Are closed at the toe. Do not wear sandals. If you  use a stepladder: Make sure that it is fully opened. Do not climb a closed stepladder. Make sure that both sides of the stepladder are locked into place. Ask someone to hold it for you, if possible. Clearly mark and make sure that you can see: Any grab bars or handrails. First and last steps. Where the edge of each step is. Use tools that help you move around (mobility aids) if they are needed. These include: Canes. Walkers. Scooters. Crutches. Turn on the lights when you go into a dark area. Replace any light bulbs as soon as they burn out. Set up your furniture so you have a clear path. Avoid moving your furniture around. If any of your floors are uneven, fix them. If there are any pets around you, be aware of where they are. Review your medicines with your doctor. Some medicines can make you feel dizzy. This can increase your chance of falling. Ask your doctor what other things that you can do to help prevent falls. This information is not intended to replace advice given to you by your health care provider. Make sure you discuss any questions you have with your health care provider. Document Released: 06/14/2009 Document Revised: 01/24/2016 Document Reviewed: 09/22/2014 Elsevier Interactive Patient Education  2017 [employer].

## 2021-06-25 DIAGNOSIS — H5200 Hypermetropia, unspecified eye: Secondary | ICD-10-CM | POA: Diagnosis not present

## 2021-06-25 DIAGNOSIS — Z0100 Encounter for examination of eyes and vision without abnormal findings: Secondary | ICD-10-CM | POA: Diagnosis not present

## 2021-07-02 ENCOUNTER — Other Ambulatory Visit: Payer: Self-pay

## 2021-07-02 ENCOUNTER — Ambulatory Visit: Payer: Medicare HMO | Admitting: Urology

## 2021-07-02 ENCOUNTER — Encounter: Payer: Self-pay | Admitting: Urology

## 2021-07-02 VITALS — BP 168/118 | HR 128 | Temp 98.4°F | Wt 182.0 lb

## 2021-07-02 DIAGNOSIS — N5239 Other post-surgical erectile dysfunction: Secondary | ICD-10-CM

## 2021-07-02 DIAGNOSIS — N5201 Erectile dysfunction due to arterial insufficiency: Secondary | ICD-10-CM | POA: Diagnosis not present

## 2021-07-02 MED ORDER — SILDENAFIL CITRATE 100 MG PO TABS: 100.0000 mg | ORAL_TABLET | Freq: Every day | ORAL | 99 refills | Status: AC | PRN

## 2021-07-02 NOTE — Progress Notes (Signed)
Urological Symptom Review  Patient is experiencing the following symptoms: Erection problems (male only)   Review of Systems  Gastrointestinal (upper)  : Indigestion/heartburn  Gastrointestinal (lower) : Diarrhea  Constitutional : Negative for symptoms  Skin: Negative for skin symptoms  Eyes: Negative for eye symptoms  Ear/Nose/Throat : Negative for Ear/Nose/Throat symptoms  Hematologic/Lymphatic: Negative for Hematologic/Lymphatic symptoms  Cardiovascular : Negative for cardiovascular symptoms  Respiratory : Shortness of breath  Endocrine: Negative for endocrine symptoms  Musculoskeletal: Back pain  Neurological: Negative for neurological symptoms  Psychologic: Negative for psychiatric symptoms

## 2021-07-02 NOTE — Progress Notes (Signed)
History of Present Illness: 65 year old male, native of [place] presents today for evaluation and management of erectile dysfunction.  The patient states for the past 2 years he has not been able to have any significant erections save for early morning erections with a full bladder.  He has no prior urologic history.  He has no prior history of curvature or pain with erections.  He denies significant injury to his penis in the past during intercourse.  He did have a prescription for Viagra but is never used it.  He thinks his wife would consent to intercourse if he were able to have an erection.  He is not on nitrates.  Past Medical History:  Diagnosis Date   Allergy    COVID-19 04/26/2021   Farmer's lung [facility])    Hypertension     Past Surgical History:  Procedure Laterality Date   CHOLECYSTECTOMY     FRACTURE SURGERY Right    hand surgery- in [place]   HERNIA REPAIR     SPLENECTOMY      Home Medications:  Allergies as of 07/02/2021       Reactions   Bee Venom Anaphylaxis   Hydrocortisone Swelling   Had back injection, and he had to be wheelchair bound for 3 months   Pollen Extract         Medication List        Accurate as of July 02, 2021  1:02 PM. If you have any questions, ask your nurse or doctor.          albuterol 108 (90 Base) MCG/ACT inhaler Commonly known as: VENTOLIN HFA Inhale 2 puffs into the lungs every 4 (four) hours as needed for wheezing or shortness of breath.   EPINEPHrine 0.3 mg/0.3 mL Soaj injection Commonly known as: EPI-PEN Inject 0.3 mg into the muscle as needed for anaphylaxis.        Allergies:  Allergies  Allergen Reactions   Bee Venom Anaphylaxis   Hydrocortisone Swelling    Had back injection, and he had to be wheelchair bound for 3 months   Pollen Extract     No family history on file.  Social History:  reports that he has quit smoking. His smoking use included cigarettes. He has a 9.00 pack-year smoking history. He  quit smokeless tobacco use about 5 years ago.  His smokeless tobacco use included chew. He reports current alcohol use of about 28.0 standard drinks per week. He reports that he does not currently use drugs.  ROS: A complete review of systems was performed.  All systems are negative except for pertinent findings as noted.  Physical Exam:  Vital signs in last 24 hours: There were no vitals taken for this visit. Constitutional:  Alert and oriented, No acute distress Cardiovascular: Regular rate  Respiratory: Normal respiratory effort GI: Abdomen is soft, nontender, nondistended, no abdominal masses. No CVAT.  No inguinal hernias. Genitourinary: Normal male phallus, testes are descended bilaterally and non-tender and without masses, scrotum is normal in appearance without lesions or masses, perineum is normal on inspection. Lymphatic: No lymphadenopathy Neurologic: Grossly intact, no focal deficits Psychiatric: Normal mood and affect  I have reviewed prior pt notes  I have reviewed notes from referring/previous physicians     Impression/Assessment:  Erectile dysfunction, probably vasculogenic  Plan:  Etiologies of the ED discussed with the patient  I will provide a prescription for sildenafil, 100 mg with refills.  He will try taking half at first  Unless his PCP won"t  refill this, I will see him back on an as-needed basis.

## 2021-07-08 ENCOUNTER — Other Ambulatory Visit: Payer: Self-pay

## 2021-07-08 ENCOUNTER — Ambulatory Visit (INDEPENDENT_AMBULATORY_CARE_PROVIDER_SITE_OTHER): Payer: Medicare HMO | Admitting: Internal Medicine

## 2021-07-08 ENCOUNTER — Encounter: Payer: Self-pay | Admitting: Internal Medicine

## 2021-07-08 VITALS — BP 148/97 | HR 95 | Ht 68.0 in | Wt 181.0 lb

## 2021-07-08 DIAGNOSIS — E782 Mixed hyperlipidemia: Secondary | ICD-10-CM

## 2021-07-08 DIAGNOSIS — I10 Essential (primary) hypertension: Secondary | ICD-10-CM

## 2021-07-08 MED ORDER — LOSARTAN POTASSIUM 25 MG PO TABS: 25.0000 mg | ORAL_TABLET | Freq: Every day | ORAL | 1 refills | Status: DC

## 2021-07-08 NOTE — Patient Instructions (Addendum)
Please start taking Losartan as prescribed.  Please start following DASH diet and perform moderate exercise/walking at least 150 mins/week.

## 2021-07-08 NOTE — Progress Notes (Signed)
Established Patient Office Visit  Subjective:  Patient ID: Kyle Jarvis, male    DOB: Jun 04, 1956  Age: 65 y.o. MRN: [id number]  CC:  Chief Complaint  Patient presents with   Follow-up    Follow up HTN    HPI Kyle Jarvis is a 65 y.o. male with past medical history of HTN who presents for follow up of HTN.  HTN: His BP was elevated in the office today.  His BP has been elevated lately including the last office visit.  He reports that he was on an antihypertensive in the past when he was in [place], but he had stopped taking it as his BP was staying in the higher normal range.  He denies any headache, dizziness, chest pain, dyspnea or palpitations.  Past Medical History:  Diagnosis Date   Allergy    COVID-19 04/26/2021   Farmer's lung [facility])    Hypertension     Past Surgical History:  Procedure Laterality Date   CHOLECYSTECTOMY     FRACTURE SURGERY Right    hand surgery- in [place]      History reviewed. No pertinent family history.  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, forensic, Air cabin crew  Tobacco Use   Smoking status: Former    Packs/day: 1.00    Years: 9.00    Pack years: 9.00    Types: Cigarettes   Smokeless tobacco: Former    Types: Chew    Quit date: 05/15/2016  Vaping Use   Vaping Use: Never used  Substance and Sexual Activity   Alcohol use: Yes    Alcohol/week: 28.0 standard drinks    Types: 14 Cans of beer, 14 Shots of liquor per week    Comment: trying to cut back   Drug use: Not Currently   Sexual activity: Not Currently    Comment: erectile dysfunction- since hernia surgery  Other Topics Concern   Not on file  Social History Narrative   Moved to [place] 2 years ago  to be closer to daughter.   Social Determinants of Health   Financial Resource Strain: Low Risk    Difficulty of Paying Living Expenses: Not hard at all  Food Insecurity: No Food Insecurity   Worried About Charity fundraiser in the Last Year: Never true   [place] in the Last Year: Never true  Transportation Needs: No Transportation Needs   Lack of Transportation (Medical): No   Lack of Transportation (Non-Medical): No  Physical Activity: Sufficiently Active   Days of Exercise per Week: 5 days   Minutes of Exercise per Session: 60 min  Stress: No Stress Concern Present   Feeling of Stress : Not at all  Social Connections: Socially Integrated   Frequency of Communication with Friends and Family: More than three times a week   Frequency of Social Gatherings with Friends and Family: More than three times a week   Attends Religious Services: More than 4 times per year   Active Member of [employer] or Organizations: Yes   Attends Music therapist: More than 4 times per year   Marital Status: Married  Human resources officer Violence: Not At  Risk   Fear of Current or Ex-Partner: No   Emotionally Abused: No   Physically Abused: No   Sexually Abused: No    Outpatient Medications Prior to Visit  Medication Sig Dispense Refill   albuterol (VENTOLIN HFA) 108 (90 Base) MCG/ACT inhaler Inhale 2 puffs into the lungs every 4 (four) hours as needed for wheezing or shortness of breath. 6.7 g 3   EPINEPHrine 0.3 mg/0.3 mL IJ SOAJ injection Inject 0.3 mg into the muscle as needed for anaphylaxis. 1 each 1   sildenafil (VIAGRA) 100 MG tablet Take 1 tablet (100 mg total) by mouth daily as needed for erectile dysfunction. 30 tablet prn   No facility-administered medications prior to visit.    Allergies  Allergen Reactions   Bee Venom Anaphylaxis   Hydrocortisone Swelling    Had back injection, and he had to be wheelchair bound for 3 months   Pollen Extract     ROS Review of  Systems  Constitutional:  Negative for chills and fever.  HENT:  Negative for congestion and sore throat.   Eyes:  Negative for pain and discharge.  Respiratory:  Negative for cough and shortness of breath.   Cardiovascular:  Negative for chest pain and palpitations.  Gastrointestinal:  Negative for constipation, diarrhea, nausea and vomiting.  Endocrine: Negative for polydipsia and polyuria.  Genitourinary:  Negative for dysuria and hematuria.  Musculoskeletal:  Negative for neck pain and neck stiffness.  Skin:  Negative for rash.  Neurological:  Negative for dizziness, weakness, numbness and headaches.  Psychiatric/Behavioral:  Negative for agitation and behavioral problems.      Objective:    Physical Exam Vitals reviewed.  Constitutional:      General: He is not in acute distress.    Appearance: He is not diaphoretic.  HENT:     Head: Normocephalic and atraumatic.     Nose: Nose normal.     Mouth/Throat:     Mouth: Mucous membranes are moist.  Eyes:     General: No scleral icterus.    Extraocular Movements: Extraocular movements intact.  Cardiovascular:     Rate and Rhythm: Normal rate and regular rhythm.     Pulses: Normal pulses.     Heart sounds: Normal heart sounds. No murmur heard. Pulmonary:     Breath sounds: Normal breath sounds. No wheezing or rales.  Musculoskeletal:     Cervical back: Neck supple. No tenderness.     Right lower leg: No edema.     Left lower leg: No edema.  Skin:    General: Skin is warm.     Findings: No rash.  Neurological:     General: No focal deficit present.     Mental Status: He is alert and oriented to person, place, and time.     Sensory: No sensory deficit.     Motor: No weakness.  Psychiatric:        Mood and Affect: Mood normal.        Behavior: Behavior normal.    BP (!) 148/97 (BP Location: Left Arm, Patient Position: Sitting, Cuff Size: Normal)   Pulse 95   Ht '5\' 8"'  (1.727 m)   Wt 181 lb (82.1 kg)   SpO2 96%   BMI  27.52 kg/m  Wt Readings from Last 3 Encounters:  07/08/21 181 lb (82.1 kg)  07/02/21 182 lb (82.6 kg)  06/24/21 179 lb (81.2 kg)    No results found for: TSH No results found for: WBC, HGB, HCT,  MCV, PLT No results found for: NA, K, CHLORIDE, CO2, GLUCOSE, BUN, CREATININE, BILITOT, ALKPHOS, AST, ALT, PROT, ALBUMIN, CALCIUM, ANIONGAP, EGFR, GFR No results found for: CHOL No results found for: HDL No results found for: LDLCALC No results found for: TRIG No results found for: CHOLHDL No results found for: HGBA1C    Assessment & Plan:   Problem List Items Addressed This Visit       Cardiovascular and Mediastinum   Essential hypertension - Primary    BP Readings from Last 1 Encounters:  07/08/21 (!) 148/97  Uncontrolled, started losartan 25 mg daily Counseled for compliance with the medications Advised DASH diet and moderate exercise/walking, at least 150 mins/week      Relevant Medications   losartan (COZAAR) 25 MG tablet   Other Relevant Orders   CBC with Differential/Platelet   CMP14+EGFR   Other Visit Diagnoses     Mixed hyperlipidemia       Relevant Medications   losartan (COZAAR) 25 MG tablet   Other Relevant Orders   Lipid panel       Meds ordered this encounter  Medications   losartan (COZAAR) 25 MG tablet    Sig: Take 1 tablet (25 mg total) by mouth daily.    Dispense:  30 tablet    Refill:  1    Follow-up: Return in about 6 weeks (around 08/19/2021) for HTN.    Lindell Spar, MD

## 2021-07-08 NOTE — Assessment & Plan Note (Signed)
BP Readings from Last 1 Encounters:  07/08/21 (!) 148/97   Uncontrolled, started losartan 25 mg daily Counseled for compliance with the medications Advised DASH diet and moderate exercise/walking, at least 150 mins/week

## 2021-07-24 ENCOUNTER — Encounter: Payer: Medicare HMO | Admitting: Nurse Practitioner

## 2021-08-06 DIAGNOSIS — I10 Essential (primary) hypertension: Secondary | ICD-10-CM | POA: Diagnosis not present

## 2021-08-06 DIAGNOSIS — Z7689 Persons encountering health services in other specified circumstances: Secondary | ICD-10-CM | POA: Diagnosis not present

## 2021-08-06 DIAGNOSIS — Z139 Encounter for screening, unspecified: Secondary | ICD-10-CM | POA: Diagnosis not present

## 2021-08-07 LAB — CBC WITH DIFFERENTIAL/PLATELET
Basophils Absolute: 0.1 10*3/uL (ref 0.0–0.2)
Basos: 2 %
EOS (ABSOLUTE): 0.4 10*3/uL (ref 0.0–0.4)
Eos: 6 %
Hematocrit: 47.7 % (ref 37.5–51.0)
Hemoglobin: 15.8 g/dL (ref 13.0–17.7)
Immature Grans (Abs): 0 10*3/uL (ref 0.0–0.1)
Immature Granulocytes: 0 %
Lymphocytes Absolute: 2.2 10*3/uL (ref 0.7–3.1)
Lymphs: 33 %
MCH: 30.6 pg (ref 26.6–33.0)
MCHC: 33.1 g/dL (ref 31.5–35.7)
MCV: 92 fL (ref 79–97)
Monocytes Absolute: 0.9 10*3/uL (ref 0.1–0.9)
Monocytes: 12 %
Neutrophils Absolute: 3.3 10*3/uL (ref 1.4–7.0)
Neutrophils: 47 %
Platelets: 324 10*3/uL (ref 150–450)
RBC: 5.16 x10E6/uL (ref 4.14–5.80)
RDW: 13.1 % (ref 11.6–15.4)
WBC: 6.9 10*3/uL (ref 3.4–10.8)

## 2021-08-07 LAB — CMP14+EGFR
ALT: 22 IU/L (ref 0–44)
AST: 27 IU/L (ref 0–40)
Albumin/Globulin Ratio: 1.5 (ref 1.2–2.2)
Albumin: 4.7 g/dL (ref 3.8–4.8)
Alkaline Phosphatase: 67 IU/L (ref 44–121)
BUN/Creatinine Ratio: 14 (ref 10–24)
BUN: 19 mg/dL (ref 8–27)
Bilirubin Total: 0.5 mg/dL (ref 0.0–1.2)
CO2: 22 mmol/L (ref 20–29)
Calcium: 10 mg/dL (ref 8.6–10.2)
Chloride: 101 mmol/L (ref 96–106)
Creatinine, Ser: 1.34 mg/dL — ABNORMAL HIGH (ref 0.76–1.27)
Globulin, Total: 3.1 g/dL (ref 1.5–4.5)
Glucose: 91 mg/dL (ref 70–99)
Potassium: 5 mmol/L (ref 3.5–5.2)
Sodium: 136 mmol/L (ref 134–144)
Total Protein: 7.8 g/dL (ref 6.0–8.5)
eGFR: 58 mL/min/{1.73_m2} — ABNORMAL LOW (ref >59)

## 2021-08-07 LAB — HEPATITIS C ANTIBODY: Hep C Virus Ab: <0.1 s/co ratio (ref 0.0–0.9)

## 2021-08-07 LAB — LIPID PANEL WITH LDL/HDL RATIO
Cholesterol, Total: 292 mg/dL — ABNORMAL HIGH (ref 100–199)
HDL: 73 mg/dL (ref >39)
LDL Chol Calc (NIH): 194 mg/dL — ABNORMAL HIGH (ref 0–99)
LDL/HDL Ratio: 2.7 ratio (ref 0.0–3.6)
Triglycerides: 138 mg/dL (ref 0–149)
VLDL Cholesterol Cal: 25 mg/dL (ref 5–40)

## 2021-08-07 NOTE — Progress Notes (Signed)
Cholesterol is elevated. Will consider adding statin after we discuss this at his next OV.

## 2021-08-19 ENCOUNTER — Other Ambulatory Visit: Payer: Self-pay

## 2021-08-19 ENCOUNTER — Ambulatory Visit (INDEPENDENT_AMBULATORY_CARE_PROVIDER_SITE_OTHER): Payer: Medicare HMO | Admitting: Nurse Practitioner

## 2021-08-19 ENCOUNTER — Encounter: Payer: Self-pay | Admitting: Nurse Practitioner

## 2021-08-19 VITALS — BP 157/104 | HR 96 | Ht 68.0 in | Wt 181.4 lb

## 2021-08-19 DIAGNOSIS — E7800 Pure hypercholesterolemia, unspecified: Secondary | ICD-10-CM | POA: Diagnosis not present

## 2021-08-19 DIAGNOSIS — I10 Essential (primary) hypertension: Secondary | ICD-10-CM

## 2021-08-19 MED ORDER — OLMESARTAN MEDOXOMIL 20 MG PO TABS: 20.0000 mg | ORAL_TABLET | Freq: Every day | ORAL | 3 refills | Status: DC

## 2021-08-19 MED ORDER — ROSUVASTATIN CALCIUM 40 MG PO TABS: 40.0000 mg | ORAL_TABLET | Freq: Every day | ORAL | 3 refills | Status: DC

## 2021-08-19 NOTE — Assessment & Plan Note (Signed)
Lab Results  Component Value Date   CHOL 292 (H) 08/06/2021   HDL 73 08/06/2021   LDLCALC 194 (H) 08/06/2021   TRIG 138 08/06/2021   -Rx. Rosuvastatin -we discussed risk for familial hypercholesterolemia -may consider PCSK-9s if response is not reobust despite max dose statin -10 year cardiovascular risk is 22.9% with current risk factors

## 2021-08-19 NOTE — Patient Instructions (Signed)
Please have fasting labs drawn 2-3 days prior to your appointment so we can discuss the results during your office visit. ° °I will be moving to [facility] located at [address], [place], [place] [address] effective Sep 01, 2021. °If you would like to establish care with [facility] please call [phone]. °

## 2021-08-19 NOTE — Progress Notes (Signed)
Acute Office Visit  Subjective:    Patient ID: Kyle Jarvis, male    DOB: November 20, 1955, 65 y.o.   MRN: [id number]  Chief Complaint  Patient presents with   Hypertension    Bp follow up    Hypertension  Patient is in today for HTN and HLD. No acute concerns.  Past Medical History:  Diagnosis Date   Allergy    COVID-19 04/26/2021   Farmer's lung [facility])    Hypertension     Past Surgical History:  Procedure Laterality Date   CHOLECYSTECTOMY     FRACTURE SURGERY Right    hand surgery- in [place]      History reviewed. No pertinent family history.  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, forensic, Air cabin crew  Tobacco Use   Smoking status: Former    Packs/day: 1.00    Years: 9.00    Pack years: 9.00    Types: Cigarettes   Smokeless tobacco: Former    Types: Chew    Quit date: 05/15/2016  Vaping Use   Vaping Use: Never used  Substance and Sexual Activity   Alcohol use: Yes    Alcohol/week: 28.0 standard drinks    Types: 14 Cans of beer, 14 Shots of liquor per week    Comment: trying to cut back   Drug use: Not Currently   Sexual activity: Not Currently    Comment: erectile dysfunction- since hernia surgery  Other Topics Concern   Not on file  Social History Narrative   Moved to [place] 2 years ago to be closer to daughter.   Social Determinants of Health   Financial Resource Strain: Low Risk    Difficulty of Paying Living Expenses: Not hard at all  Food Insecurity: No Food Insecurity   Worried About Charity fundraiser in the Last Year: Never true   [place] in the Last Year: Never true  Transportation Needs: No Transportation Needs   Lack of Transportation (Medical): No    Lack of Transportation (Non-Medical): No  Physical Activity: Sufficiently Active   Days of Exercise per Week: 5 days   Minutes of Exercise per Session: 60 min  Stress: No Stress Concern Present   Feeling of Stress : Not at all  Social Connections: Socially Integrated   Frequency of Communication with Friends and Family: More than three times a week   Frequency of Social Gatherings with Friends and Family: More than three times a week   Attends Religious Services: More than 4 times per year   Active Member of [employer] or Organizations: Yes   Attends Music therapist: More than 4 times per year   Marital Status: Married  Human resources officer Violence: Not At Risk   Fear of Current or Ex-Partner: No   Emotionally Abused: No   Physically Abused: No   Sexually Abused: No    Outpatient Medications Prior to Visit  Medication Sig Dispense Refill   albuterol (VENTOLIN HFA) 108 (90 Base) MCG/ACT inhaler Inhale 2 puffs into the lungs every 4 (four) hours as needed for wheezing or shortness of breath. 6.7 g 3   EPINEPHrine 0.3 mg/0.3 mL IJ SOAJ  injection Inject 0.3 mg into the muscle as needed for anaphylaxis. 1 each 1   sildenafil (VIAGRA) 100 MG tablet Take 1 tablet (100 mg total) by mouth daily as needed for erectile dysfunction. 30 tablet prn   losartan (COZAAR) 25 MG tablet Take 1 tablet (25 mg total) by mouth daily. 30 tablet 1   No facility-administered medications prior to visit.    Allergies  Allergen Reactions   Bee Venom Anaphylaxis   Hydrocortisone Swelling    Had back injection, and he had to be wheelchair bound for 3 months   Pollen Extract     Review of Systems  Constitutional: Negative.   Respiratory: Negative.    Cardiovascular: Negative.   Musculoskeletal: Negative.   Psychiatric/Behavioral: Negative.        Objective:    Physical Exam Constitutional:      Appearance: Normal appearance.  Cardiovascular:     Rate and Rhythm: Normal rate and regular  rhythm.     Pulses: Normal pulses.     Heart sounds: Normal heart sounds.  Pulmonary:     Effort: Pulmonary effort is normal.     Breath sounds: Normal breath sounds.  Musculoskeletal:        General: Normal range of motion.  Neurological:     Mental Status: He is alert.  Psychiatric:        Mood and Affect: Mood normal.        Behavior: Behavior normal.        Thought Content: Thought content normal.        Judgment: Judgment normal.    BP (!) 157/104    Pulse 96    Ht '5\' 8"'  (1.727 m)    Wt 181 lb 6.4 oz (82.3 kg)    SpO2 96%    BMI 27.58 kg/m  Wt Readings from Last 3 Encounters:  08/19/21 181 lb 6.4 oz (82.3 kg)  07/08/21 181 lb (82.1 kg)  07/02/21 182 lb (82.6 kg)    Health Maintenance Due  Topic Date Due   COVID-19 Vaccine (1) Never done   COLONOSCOPY (Pts 45-24yr Insurance coverage will need to be confirmed)  Never done   Zoster Vaccines- Shingrix (1 of 2) Never done    There are no preventive care reminders to display for this patient.   No results found for: TSH Lab Results  Component Value Date   WBC 6.9 08/06/2021   HGB 15.8 08/06/2021   HCT 47.7 08/06/2021   MCV 92 08/06/2021   PLT 324 08/06/2021   Lab Results  Component Value Date   NA 136 08/06/2021   K 5.0 08/06/2021   CO2 22 08/06/2021   GLUCOSE 91 08/06/2021   BUN 19 08/06/2021   CREATININE 1.34 (H) 08/06/2021   BILITOT 0.5 08/06/2021   ALKPHOS 67 08/06/2021   AST 27 08/06/2021   ALT 22 08/06/2021   PROT 7.8 08/06/2021   ALBUMIN 4.7 08/06/2021   CALCIUM 10.0 08/06/2021   EGFR 58 (L) 08/06/2021   Lab Results  Component Value Date   CHOL 292 (H) 08/06/2021   Lab Results  Component Value Date   HDL 73 08/06/2021   Lab Results  Component Value Date   LDLCALC 194 (H) 08/06/2021   Lab Results  Component Value Date   TRIG 138 08/06/2021   No results found for: CHOLHDL No results found for: HGBA1C     Assessment & Plan:   Problem List Items Addressed This Visit  Cardiovascular and Mediastinum   Essential hypertension - Primary    BP Readings from Last 3 Encounters:  08/19/21 (!) 157/104  07/08/21 (!) 148/97  07/02/21 (!) 168/118  -STOP losartan; EMR said this med wasn't covered -Rx. olmesartan      Relevant Medications   rosuvastatin (CRESTOR) 40 MG tablet   olmesartan (BENICAR) 20 MG tablet   Other Relevant Orders   CBC with Differential/Platelet   CMP14+EGFR   Lipid Panel With LDL/HDL Ratio     Other   Pure hypercholesterolemia    Lab Results  Component Value Date   CHOL 292 (H) 08/06/2021   HDL 73 08/06/2021   LDLCALC 194 (H) 08/06/2021   TRIG 138 08/06/2021  -Rx. Rosuvastatin -we discussed risk for familial hypercholesterolemia -may consider PCSK-9s if response is not reobust despite max dose statin -10 year cardiovascular risk is 22.9% with current risk factors      Relevant Medications   rosuvastatin (CRESTOR) 40 MG tablet   olmesartan (BENICAR) 20 MG tablet   Other Relevant Orders   Lipid Panel With LDL/HDL Ratio     Meds ordered this encounter  Medications   rosuvastatin (CRESTOR) 40 MG tablet    Sig: Take 1 tablet (40 mg total) by mouth daily.    Dispense:  90 tablet    Refill:  3   olmesartan (BENICAR) 20 MG tablet    Sig: Take 1 tablet (20 mg total) by mouth daily.    Dispense:  90 tablet    Refill:  3    STOP losartan     Noreene Larsson, NP

## 2021-08-19 NOTE — Assessment & Plan Note (Addendum)
BP Readings from Last 3 Encounters:  08/19/21 (!) 157/104  07/08/21 (!) 148/97  07/02/21 (!) 168/118   -STOP losartan; EMR said this med wasn't covered -Rx. olmesartan

## 2021-08-28 ENCOUNTER — Other Ambulatory Visit: Payer: Self-pay | Admitting: Internal Medicine

## 2021-08-28 DIAGNOSIS — I10 Essential (primary) hypertension: Secondary | ICD-10-CM

## 2021-10-25 DIAGNOSIS — Z720 Tobacco use: Secondary | ICD-10-CM | POA: Diagnosis not present

## 2021-10-25 DIAGNOSIS — I10 Essential (primary) hypertension: Secondary | ICD-10-CM | POA: Diagnosis not present

## 2021-10-25 DIAGNOSIS — M1990 Unspecified osteoarthritis, unspecified site: Secondary | ICD-10-CM | POA: Diagnosis not present

## 2021-10-25 DIAGNOSIS — Z823 Family history of stroke: Secondary | ICD-10-CM | POA: Diagnosis not present

## 2021-10-25 DIAGNOSIS — Z008 Encounter for other general examination: Secondary | ICD-10-CM | POA: Diagnosis not present

## 2021-10-25 DIAGNOSIS — Z8249 Family history of ischemic heart disease and other diseases of the circulatory system: Secondary | ICD-10-CM | POA: Diagnosis not present

## 2021-10-25 DIAGNOSIS — E785 Hyperlipidemia, unspecified: Secondary | ICD-10-CM | POA: Diagnosis not present

## 2021-10-25 DIAGNOSIS — Z809 Family history of malignant neoplasm, unspecified: Secondary | ICD-10-CM | POA: Diagnosis not present

## 2021-10-25 DIAGNOSIS — J670 Farmer's lung: Secondary | ICD-10-CM | POA: Diagnosis not present

## 2021-11-15 DIAGNOSIS — E7800 Pure hypercholesterolemia, unspecified: Secondary | ICD-10-CM | POA: Diagnosis not present

## 2021-11-15 DIAGNOSIS — I10 Essential (primary) hypertension: Secondary | ICD-10-CM | POA: Diagnosis not present

## 2021-11-16 LAB — CBC WITH DIFFERENTIAL/PLATELET
Basophils Absolute: 0.1 10*3/uL (ref 0.0–0.2)
Basos: 1 %
EOS (ABSOLUTE): 0.5 10*3/uL — ABNORMAL HIGH (ref 0.0–0.4)
Eos: 6 %
Hematocrit: 44.8 % (ref 37.5–51.0)
Hemoglobin: 15.2 g/dL (ref 13.0–17.7)
Immature Grans (Abs): 0 10*3/uL (ref 0.0–0.1)
Immature Granulocytes: 0 %
Lymphocytes Absolute: 2.8 10*3/uL (ref 0.7–3.1)
Lymphs: 32 %
MCH: 30.8 pg (ref 26.6–33.0)
MCHC: 33.9 g/dL (ref 31.5–35.7)
MCV: 91 fL (ref 79–97)
Monocytes Absolute: 1 10*3/uL — ABNORMAL HIGH (ref 0.1–0.9)
Monocytes: 12 %
Neutrophils Absolute: 4.3 10*3/uL (ref 1.4–7.0)
Neutrophils: 49 %
Platelets: 285 10*3/uL (ref 150–450)
RBC: 4.93 x10E6/uL (ref 4.14–5.80)
RDW: 12.7 % (ref 11.6–15.4)
WBC: 8.7 10*3/uL (ref 3.4–10.8)

## 2021-11-16 LAB — CMP14+EGFR
ALT: 35 IU/L (ref 0–44)
AST: 35 IU/L (ref 0–40)
Albumin/Globulin Ratio: 1.5 (ref 1.2–2.2)
Albumin: 4.8 g/dL (ref 3.8–4.8)
Alkaline Phosphatase: 61 IU/L (ref 44–121)
BUN/Creatinine Ratio: 19 (ref 10–24)
BUN: 26 mg/dL (ref 8–27)
Bilirubin Total: 0.4 mg/dL (ref 0.0–1.2)
CO2: 22 mmol/L (ref 20–29)
Calcium: 10 mg/dL (ref 8.6–10.2)
Chloride: 104 mmol/L (ref 96–106)
Creatinine, Ser: 1.37 mg/dL — ABNORMAL HIGH (ref 0.76–1.27)
Globulin, Total: 3.1 g/dL (ref 1.5–4.5)
Glucose: 97 mg/dL (ref 70–99)
Potassium: 5.2 mmol/L (ref 3.5–5.2)
Sodium: 139 mmol/L (ref 134–144)
Total Protein: 7.9 g/dL (ref 6.0–8.5)
eGFR: 57 mL/min/{1.73_m2} — ABNORMAL LOW (ref >59)

## 2021-11-16 LAB — LIPID PANEL WITH LDL/HDL RATIO
Cholesterol, Total: 230 mg/dL — ABNORMAL HIGH (ref 100–199)
HDL: 75 mg/dL (ref >39)
LDL Chol Calc (NIH): 142 mg/dL — ABNORMAL HIGH (ref 0–99)
LDL/HDL Ratio: 1.9 ratio (ref 0.0–3.6)
Triglycerides: 76 mg/dL (ref 0–149)
VLDL Cholesterol Cal: 13 mg/dL (ref 5–40)

## 2021-11-19 ENCOUNTER — Ambulatory Visit (INDEPENDENT_AMBULATORY_CARE_PROVIDER_SITE_OTHER): Payer: Medicare HMO | Admitting: Internal Medicine

## 2021-11-19 ENCOUNTER — Other Ambulatory Visit: Payer: Self-pay | Admitting: *Deleted

## 2021-11-19 ENCOUNTER — Encounter: Payer: Self-pay | Admitting: Internal Medicine

## 2021-11-19 ENCOUNTER — Other Ambulatory Visit: Payer: Self-pay

## 2021-11-19 VITALS — BP 132/84 | HR 95 | Resp 18 | Ht 68.0 in | Wt 182.6 lb

## 2021-11-19 DIAGNOSIS — E782 Mixed hyperlipidemia: Secondary | ICD-10-CM

## 2021-11-19 DIAGNOSIS — I10 Essential (primary) hypertension: Secondary | ICD-10-CM

## 2021-11-19 DIAGNOSIS — M48061 Spinal stenosis, lumbar region without neurogenic claudication: Secondary | ICD-10-CM | POA: Insufficient documentation

## 2021-11-19 DIAGNOSIS — E7800 Pure hypercholesterolemia, unspecified: Secondary | ICD-10-CM

## 2021-11-19 DIAGNOSIS — M5126 Other intervertebral disc displacement, lumbar region: Secondary | ICD-10-CM | POA: Insufficient documentation

## 2021-11-19 MED ORDER — OLMESARTAN MEDOXOMIL 20 MG PO TABS: 20.0000 mg | ORAL_TABLET | Freq: Every day | ORAL | 3 refills | Status: DC

## 2021-11-19 MED ORDER — ROSUVASTATIN CALCIUM 40 MG PO TABS: 40.0000 mg | ORAL_TABLET | Freq: Every day | ORAL | 3 refills | Status: DC

## 2021-11-19 NOTE — Assessment & Plan Note (Signed)
BP Readings from Last 1 Encounters:  ?11/19/21 132/84  ? ?Well-controlled with Olmesartan ?Counseled for compliance with the medications ?Advised DASH diet and moderate exercise/walking, at least 150 mins/week ?

## 2021-11-19 NOTE — Assessment & Plan Note (Signed)
Chronic low back pain ?Has had lumbar spine MRI in the past, will try to get records from [place] ?Has had steroid injections in the past, but does not want to get it now ?Will check MRI of lumbar spine ?Referred to spine surgery ?Advised to avoid heavy lifting and frequent bending ?Tylenol as needed ?Heating pad and/or back brace as needed ?

## 2021-11-19 NOTE — Assessment & Plan Note (Signed)
Lipid profile reviewed Continue Crestor 

## 2021-11-19 NOTE — Progress Notes (Addendum)
? ?Established Patient Office Visit ? ?Subjective:  ?Patient ID: Kyle Jarvis, male    DOB: 07/18/1956  Age: 66 y.o. MRN: [id number] ? ?CC:  ?Chief Complaint  ?Patient presents with  ? Follow-up  ?  3 month follow up pt having pain out of bulging disc 345 and sometimes loses his balance   ? ? ?HPI ?Kyle Jarvis is a 66 y.o. male with past medical history of HTN and HLD who presents for f/u of his chronic medical conditions. ? ?HTN: BP is well-controlled. Takes medications regularly. Patient denies headache, dizziness, chest pain, dyspnea or palpitations. ? ?HLD: He takes Crestor now.  He is lipid profile has improved now. ? ?He complains of chronic low back pain, which is worse recently.  Denies any recent injury or fall.  He has had lumbar spine MRI in the past in [place], which showed L3-L4, L4-L5 disc bulging.  He has had steroid injection in the back, which was complicated by prolonged bedrest.  He currently states that his back pain is intermittent, worse with movement and radiates to bilateral LE.  He also reports weakness of the LE, but denies any numbness or tingling.  Denies any saddle anesthesia, urinary or stool incontinence currently. ? ? ?Past Medical History:  ?Diagnosis Date  ? Allergy   ? COVID-19 04/26/2021  ? [facility]'s lung ([place])   ? Hypertension   ? ? ?Past Surgical History:  ?Procedure Laterality Date  ? CHOLECYSTECTOMY    ? FRACTURE SURGERY Right   ? hand surgery- in [place]  ? HERNIA REPAIR    ? SPLENECTOMY    ? ? ?History reviewed. No pertinent family history. ? ?Social History  ? ?Socioeconomic History  ? Marital status: Married  ?  Spouse name: Not on file  ? Number of children: 1  ? Years of education: Not on file  ? Highest education level: Not on file  ?Occupational History  ? Occupation: Retired  ?  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 Smoking status: Former  ?  Packs/day: 1.00  ?  Years: 9.00  ?  Pack years: 9.00  ?  Types: Cigarettes  ? Smokeless  tobacco: Former  ?  Types: Chew  ?  Quit date: 05/15/2016  ?Vaping Use  ? Vaping Use: Never used  ?Substance and Sexual Activity  ? Alcohol use: Yes  ?  Alcohol/week: 28.0 standard drinks  ?  Types: 14 Cans of beer, 14 Shots of liquor per week  ?  Comment: trying to cut back  ? Drug use: Not Currently  ? Sexual activity: Not Currently  ?  Comment: erectile dysfunction- since hernia surgery  ?Other Topics Concern  ? Not on file  ?Social History Narrative  ? Moved to [place] 2 years ago to be closer to daughter.  ? ?Social Determinants of Health  ? ?Financial Resource Strain: Low Risk   ? Difficulty of Paying Living Expenses: Not hard at all  ?Food Insecurity: No Food Insecurity  ? Worried About Charity fundraiser in the Last Year: Never true  ? Ran Out of Food in the Last Year: Never true  ?Transportation Needs: No Transportation Needs  ? Lack of Transportation (Medical): No  ? Lack of Transportation (Non-Medical): No  ?Physical Activity: Sufficiently Active  ? Days of Exercise per Week: 5 days  ? Minutes of Exercise per Session: 60 min  ?Stress: No Stress Concern Present  ? Feeling of Stress : Not at all  ?  Social Connections: Socially Integrated  ? Frequency of Communication with Friends and Family: More than three times a week  ? Frequency of Social Gatherings with Friends and Family: More than three times a week  ? Attends Religious Services: More than 4 times per year  ? Active Member of Clubs or Organizations: Yes  ? Attends Archivist Meetings: More than 4 times per year  ? Marital Status: Married  ?Intimate Partner Violence: Not At Risk  ? Fear of Current or Ex-Partner: No  ? Emotionally Abused: No  ? Physically Abused: No  ? Sexually Abused: No  ? ? ?Outpatient Medications Prior to Visit  ?Medication Sig Dispense Refill  ? albuterol (VENTOLIN HFA) 108 (90 Base) MCG/ACT inhaler Inhale 2 puffs into the lungs every 4 (four) hours as needed for wheezing or shortness of breath. 6.7 g 3  ? EPINEPHrine  0.3 mg/0.3 mL IJ SOAJ injection Inject 0.3 mg into the muscle as needed for anaphylaxis. 1 each 1  ? sildenafil (VIAGRA) 100 MG tablet Take 1 tablet (100 mg total) by mouth daily as needed for erectile dysfunction. 30 tablet prn  ? losartan (COZAAR) 25 MG tablet TAKE 1 TABLET BY MOUTH EVERY DAY 30 tablet 1  ? olmesartan (BENICAR) 20 MG tablet Take 1 tablet (20 mg total) by mouth daily. 90 tablet 3  ? rosuvastatin (CRESTOR) 40 MG tablet Take 1 tablet (40 mg total) by mouth daily. 90 tablet 3  ? ?No facility-administered medications prior to visit.  ? ? ?Allergies  ?Allergen Reactions  ? Bee Venom Anaphylaxis  ? Hydrocortisone Swelling  ?  Had back injection, and he had to be wheelchair bound for 3 months  ? Pollen Extract   ? ? ?ROS ?Review of Systems  ?Constitutional:  Negative for chills and fever.  ?HENT:  Negative for congestion and sore throat.   ?Eyes:  Negative for pain and discharge.  ?Respiratory:  Negative for cough and shortness of breath.   ?Cardiovascular:  Negative for chest pain and palpitations.  ?Gastrointestinal:  Negative for diarrhea, nausea and vomiting.  ?Endocrine: Negative for polydipsia and polyuria.  ?Genitourinary:  Negative for dysuria and hematuria.  ?Musculoskeletal:  Positive for back pain. Negative for neck pain and neck stiffness.  ?Skin:  Negative for rash.  ?Neurological:  Positive for weakness. Negative for dizziness, numbness and headaches.  ?Psychiatric/Behavioral:  Negative for agitation and behavioral problems.   ? ?  ?Objective:  ?  ?Physical Exam ?Vitals reviewed.  ?Constitutional:   ?   General: He is not in acute distress. ?   Appearance: He is not diaphoretic.  ?HENT:  ?   Head: Normocephalic and atraumatic.  ?   Nose: Nose normal.  ?   Mouth/Throat:  ?   Mouth: Mucous membranes are moist.  ?Eyes:  ?   General: No scleral icterus. ?   Extraocular Movements: Extraocular movements intact.  ?Cardiovascular:  ?   Rate and Rhythm: Normal rate and regular rhythm.  ?   Pulses:  Normal pulses.  ?   Heart sounds: Normal heart sounds. No murmur heard. ?Pulmonary:  ?   Breath sounds: Normal breath sounds. No wheezing or rales.  ?Musculoskeletal:  ?   Cervical back: Neck supple. No tenderness.  ?   Right lower leg: No edema.  ?   Left lower leg: No edema.  ?Skin: ?   General: Skin is warm.  ?   Findings: No rash.  ?Neurological:  ?   General: No focal deficit  present.  ?   Mental Status: He is alert and oriented to person, place, and time.  ?   Sensory: No sensory deficit.  ?   Motor: No weakness.  ?Psychiatric:     ?   Mood and Affect: Mood normal.     ?   Behavior: Behavior normal.  ? ? ?BP 132/84 (BP Location: Left Arm, Patient Position: Sitting, Cuff Size: Normal)   Pulse 95   Resp 18   Ht '5\' 8"'  (1.727 m)   Wt 182 lb 9.6 oz (82.8 kg)   SpO2 95%   BMI 27.76 kg/m?  ?Wt Readings from Last 3 Encounters:  ?11/19/21 182 lb 9.6 oz (82.8 kg)  ?08/19/21 181 lb 6.4 oz (82.3 kg)  ?07/08/21 181 lb (82.1 kg)  ? ? ?No results found for: TSH ?Lab Results  ?Component Value Date  ? WBC 8.7 11/15/2021  ? HGB 15.2 11/15/2021  ? HCT 44.8 11/15/2021  ? MCV 91 11/15/2021  ? PLT 285 11/15/2021  ? ?Lab Results  ?Component Value Date  ? NA 139 11/15/2021  ? K 5.2 11/15/2021  ? CO2 22 11/15/2021  ? GLUCOSE 97 11/15/2021  ? BUN 26 11/15/2021  ? CREATININE 1.37 (H) 11/15/2021  ? BILITOT 0.4 11/15/2021  ? ALKPHOS 61 11/15/2021  ? AST 35 11/15/2021  ? ALT 35 11/15/2021  ? PROT 7.9 11/15/2021  ? ALBUMIN 4.8 11/15/2021  ? CALCIUM 10.0 11/15/2021  ? EGFR 57 (L) 11/15/2021  ? ?Lab Results  ?Component Value Date  ? CHOL 230 (H) 11/15/2021  ? ?Lab Results  ?Component Value Date  ? HDL 75 11/15/2021  ? ?Lab Results  ?Component Value Date  ? LDLCALC 142 (H) 11/15/2021  ? ?Lab Results  ?Component Value Date  ? TRIG 76 11/15/2021  ? ?No results found for: CHOLHDL ?No results found for: HGBA1C ? ?  ?Assessment & Plan:  ? ?Problem List Items Addressed This Visit   ? ?  ? Cardiovascular and Mediastinum  ? Essential  hypertension - Primary  ?  BP Readings from Last 1 Encounters:  ?11/19/21 132/84  ?Well-controlled with Olmesartan ?Counseled for compliance with the medications ?Advised DASH diet and moderate exercise/walking, at lea

## 2021-11-19 NOTE — Patient Instructions (Signed)
Please continue taking medications as prescribed. ? ?Please continue to follow low salt diet and ambulate as tolerated. ? ?Please perform simple back exercises. ?

## 2021-12-05 ENCOUNTER — Ambulatory Visit (HOSPITAL_COMMUNITY)
Admission: RE | Admit: 2021-12-05 | Discharge: 2021-12-05 | Disposition: A | Discharge status: Home / Self Care | Payer: Medicare HMO | Source: Ambulatory Visit | Attending: Internal Medicine | Admitting: Internal Medicine

## 2021-12-05 DIAGNOSIS — M5116 Intervertebral disc disorders with radiculopathy, lumbar region: Secondary | ICD-10-CM | POA: Diagnosis not present

## 2021-12-05 DIAGNOSIS — M48061 Spinal stenosis, lumbar region without neurogenic claudication: Secondary | ICD-10-CM | POA: Diagnosis not present

## 2021-12-05 DIAGNOSIS — M4726 Other spondylosis with radiculopathy, lumbar region: Secondary | ICD-10-CM | POA: Diagnosis not present

## 2021-12-05 DIAGNOSIS — M5126 Other intervertebral disc displacement, lumbar region: Secondary | ICD-10-CM | POA: Insufficient documentation

## 2021-12-09 ENCOUNTER — Other Ambulatory Visit: Payer: Self-pay | Admitting: *Deleted

## 2021-12-09 DIAGNOSIS — M5126 Other intervertebral disc displacement, lumbar region: Secondary | ICD-10-CM

## 2021-12-20 DIAGNOSIS — M5416 Radiculopathy, lumbar region: Secondary | ICD-10-CM | POA: Diagnosis not present

## 2021-12-20 DIAGNOSIS — Z6827 Body mass index (BMI) 27.0-27.9, adult: Secondary | ICD-10-CM | POA: Diagnosis not present

## 2022-01-03 DIAGNOSIS — M5416 Radiculopathy, lumbar region: Secondary | ICD-10-CM | POA: Diagnosis not present

## 2022-01-03 DIAGNOSIS — M6281 Muscle weakness (generalized): Secondary | ICD-10-CM | POA: Diagnosis not present

## 2022-01-06 DIAGNOSIS — M5416 Radiculopathy, lumbar region: Secondary | ICD-10-CM | POA: Diagnosis not present

## 2022-01-06 DIAGNOSIS — M6281 Muscle weakness (generalized): Secondary | ICD-10-CM | POA: Diagnosis not present

## 2022-01-08 DIAGNOSIS — M5416 Radiculopathy, lumbar region: Secondary | ICD-10-CM | POA: Diagnosis not present

## 2022-01-08 DIAGNOSIS — M6281 Muscle weakness (generalized): Secondary | ICD-10-CM | POA: Diagnosis not present

## 2022-01-15 DIAGNOSIS — M5416 Radiculopathy, lumbar region: Secondary | ICD-10-CM | POA: Diagnosis not present

## 2022-01-15 DIAGNOSIS — M6281 Muscle weakness (generalized): Secondary | ICD-10-CM | POA: Diagnosis not present

## 2022-01-22 DIAGNOSIS — M6281 Muscle weakness (generalized): Secondary | ICD-10-CM | POA: Diagnosis not present

## 2022-01-22 DIAGNOSIS — M5416 Radiculopathy, lumbar region: Secondary | ICD-10-CM | POA: Diagnosis not present

## 2022-01-29 DIAGNOSIS — M5416 Radiculopathy, lumbar region: Secondary | ICD-10-CM | POA: Diagnosis not present

## 2022-01-29 DIAGNOSIS — M6281 Muscle weakness (generalized): Secondary | ICD-10-CM | POA: Diagnosis not present

## 2022-01-31 DIAGNOSIS — Z6827 Body mass index (BMI) 27.0-27.9, adult: Secondary | ICD-10-CM | POA: Diagnosis not present

## 2022-01-31 DIAGNOSIS — M5416 Radiculopathy, lumbar region: Secondary | ICD-10-CM | POA: Diagnosis not present

## 2022-02-05 ENCOUNTER — Ambulatory Visit (INDEPENDENT_AMBULATORY_CARE_PROVIDER_SITE_OTHER): Payer: Medicare HMO | Admitting: Internal Medicine

## 2022-02-05 ENCOUNTER — Encounter: Payer: Self-pay | Admitting: Internal Medicine

## 2022-02-05 VITALS — BP 118/82 | HR 106 | Resp 18 | Ht 68.0 in | Wt 180.8 lb

## 2022-02-05 DIAGNOSIS — E782 Mixed hyperlipidemia: Secondary | ICD-10-CM

## 2022-02-05 DIAGNOSIS — I10 Essential (primary) hypertension: Secondary | ICD-10-CM

## 2022-02-05 DIAGNOSIS — Z01818 Encounter for other preprocedural examination: Secondary | ICD-10-CM

## 2022-02-05 DIAGNOSIS — M48061 Spinal stenosis, lumbar region without neurogenic claudication: Secondary | ICD-10-CM

## 2022-02-05 NOTE — Progress Notes (Signed)
Established Patient Office Visit  Subjective:  Patient ID: Kyle Jarvis, male    DOB: 05/30/1956  Age: 66 y.o. MRN: [id number]  CC:  Chief Complaint  Patient presents with   Follow-up    Pt is getting back surgery set up they are waiting on surgery clearance tucker thomas in [place] is doing surgery     HPI Kyle Jarvis is a 66 y.o. male with past medical history of HTN and HLD who presents for f/u of his chronic medical conditions and preop evaluation.  HTN: BP is well-controlled. Takes medications regularly. Patient denies headache, dizziness, chest pain, dyspnea or palpitations.   HLD: He takes Crestor now.  Lumbar spinal stenosis: He complains of chronic low back pain, which is worse recently.  Denies any recent injury or fall.  He had lumbar spine MRI, which showed lumbar spinal stenosis and DDD of lumbar spine.  He also reports weakness of the LE, but denies any numbness or tingling.  He has seen neurosurgeon for it and is planned to get lumbar spinal surgery.  Past Medical History:  Diagnosis Date   Allergy    COVID-19 04/26/2021   [facility]mer's lung [facility])    Hypertension     Past Surgical History:  Procedure Laterality Date   CHOLECYSTECTOMY     FRACTURE SURGERY Right    hand surgery- in [place]      History reviewed. No pertinent family history.  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, forensic, Air cabin crew  Tobacco Use   Smoking status: Former    Packs/day: 1.00    Years: 9.00    Pack years: 9.00    Types: Cigarettes   Smokeless tobacco: Former    Types: Chew    Quit date: 05/15/2016  Vaping Use   Vaping Use: Never used  Substance and Sexual Activity   Alcohol use: Yes     Alcohol/week: 28.0 standard drinks    Types: 14 Cans of beer, 14 Shots of liquor per week    Comment: trying to cut back   Drug use: Not Currently   Sexual activity: Not Currently    Comment: erectile dysfunction- since hernia surgery  Other Topics Concern   Not on file  Social History Narrative   Moved to [place] 2 years ago to be closer to daughter.   Social Determinants of Health   Financial Resource Strain: Low Risk    Difficulty of Paying Living Expenses: Not hard at all  Food Insecurity: No Food Insecurity   Worried About Charity fundraiser in the Last Year: Never true   [place] in the Last Year: Never true  Transportation Needs: No Transportation Needs   Lack of Transportation (Medical): No   Lack of Transportation (Non-Medical): No  Physical Activity: Sufficiently Active   Days of Exercise per Week: 5 days   Minutes of Exercise per Session: 60 min  Stress: No Stress Concern Present   Feeling of Stress : Not at all  Social Connections: Socially Integrated   Frequency of Communication with Friends and Family: More than three times a week   Frequency of Social Gatherings with Friends and Family:  More than three times a week   Attends Religious Services: More than 4 times per year   Active Member of Clubs or Organizations: Yes   Attends Music therapist: More than 4 times per year   Marital Status: Married  Human resources officer Violence: Not At Risk   Fear of Current or Ex-Partner: No   Emotionally Abused: No   Physically Abused: No   Sexually Abused: No    Outpatient Medications Prior to Visit  Medication Sig Dispense Refill   albuterol (VENTOLIN HFA) 108 (90 Base) MCG/ACT inhaler Inhale 2 puffs into the lungs every 4 (four) hours as needed for wheezing or shortness of breath. 6.7 g 3   EPINEPHrine 0.3 mg/0.3 mL IJ SOAJ injection Inject 0.3 mg into the muscle as needed for anaphylaxis. 1 each 1   olmesartan (BENICAR) 20 MG tablet Take 1 tablet (20  mg total) by mouth daily. 90 tablet 3   rosuvastatin (CRESTOR) 40 MG tablet Take 1 tablet (40 mg total) by mouth daily. 90 tablet 3   sildenafil (VIAGRA) 100 MG tablet Take 1 tablet (100 mg total) by mouth daily as needed for erectile dysfunction. 30 tablet prn   No facility-administered medications prior to visit.    Allergies  Allergen Reactions   Bee Venom Anaphylaxis   Hydrocortisone Swelling    Had back injection, and he had to be wheelchair bound for 3 months   Pollen Extract     ROS Review of Systems  Constitutional:  Negative for chills and fever.  HENT:  Negative for congestion and sore throat.   Eyes:  Negative for pain and discharge.  Respiratory:  Negative for cough and shortness of breath.   Cardiovascular:  Negative for chest pain and palpitations.  Gastrointestinal:  Negative for diarrhea, nausea and vomiting.  Endocrine: Negative for polydipsia and polyuria.  Genitourinary:  Negative for dysuria and hematuria.  Musculoskeletal:  Positive for back pain. Negative for neck pain and neck stiffness.  Skin:  Negative for rash.  Neurological:  Positive for weakness. Negative for dizziness, numbness and headaches.  Psychiatric/Behavioral:  Negative for agitation and behavioral problems.      Objective:    Physical Exam Vitals reviewed.  Constitutional:      General: He is not in acute distress.    Appearance: He is not diaphoretic.  HENT:     Head: Normocephalic and atraumatic.     Nose: Nose normal.     Mouth/Throat:     Mouth: Mucous membranes are moist.  Eyes:     General: No scleral icterus.    Extraocular Movements: Extraocular movements intact.  Cardiovascular:     Rate and Rhythm: Regular rhythm. Tachycardia present.     Pulses: Normal pulses.     Heart sounds: Normal heart sounds. No murmur heard. Pulmonary:     Breath sounds: Normal breath sounds. No wheezing or rales.  Musculoskeletal:     Cervical back: Neck supple. No tenderness.     Right  lower leg: No edema.     Left lower leg: No edema.  Skin:    General: Skin is warm.     Findings: No rash.  Neurological:     General: No focal deficit present.     Mental Status: He is alert and oriented to person, place, and time.     Sensory: No sensory deficit.     Motor: No weakness.  Psychiatric:        Mood and Affect: Mood normal.  Behavior: Behavior normal.    BP 118/82 (BP Location: Right Arm, Patient Position: Sitting, Cuff Size: Normal)   Pulse (!) 106   Resp 18   Ht '5\' 8"'  (1.727 m)   Wt 180 lb 12.8 oz (82 kg)   SpO2 97%   BMI 27.49 kg/m  Wt Readings from Last 3 Encounters:  02/05/22 180 lb 12.8 oz (82 kg)  11/19/21 182 lb 9.6 oz (82.8 kg)  08/19/21 181 lb 6.4 oz (82.3 kg)    No results found for: TSH Lab Results  Component Value Date   WBC 8.7 11/15/2021   HGB 15.2 11/15/2021   HCT 44.8 11/15/2021   MCV 91 11/15/2021   PLT 285 11/15/2021   Lab Results  Component Value Date   NA 139 11/15/2021   K 5.2 11/15/2021   CO2 22 11/15/2021   GLUCOSE 97 11/15/2021   BUN 26 11/15/2021   CREATININE 1.37 (H) 11/15/2021   BILITOT 0.4 11/15/2021   ALKPHOS 61 11/15/2021   AST 35 11/15/2021   ALT 35 11/15/2021   PROT 7.9 11/15/2021   ALBUMIN 4.8 11/15/2021   CALCIUM 10.0 11/15/2021   EGFR 57 (L) 11/15/2021   Lab Results  Component Value Date   CHOL 230 (H) 11/15/2021   Lab Results  Component Value Date   HDL 75 11/15/2021   Lab Results  Component Value Date   LDLCALC 142 (H) 11/15/2021   Lab Results  Component Value Date   TRIG 76 11/15/2021   No results found for: CHOLHDL No results found for: HGBA1C    Assessment & Plan:   Problem List Items Addressed This Visit       Cardiovascular and Mediastinum   Essential hypertension    BP Readings from Last 1 Encounters:  02/05/22 118/82  Well-controlled with Olmesartan Counseled for compliance with the medications Advised DASH diet and moderate exercise/walking, at least 150 mins/week         Other   Mixed hyperlipidemia    Lipid profile reviewed Continue Crestor       Lumbar spinal stenosis    Chronic low back pain Has had steroid injections in the past, but does not want to get it now Followed by spine surgery Advised to avoid heavy lifting and frequent bending Tylenol as needed Heating pad and/or back brace as needed       Preop examination - Primary    Planned to get lumbar spinal surgery for lumbar spinal stenosis Medically optimized for lumbar spinal surgery with moderate risk EKG: Sinus rhythm.  No signs of active ischemia. Tachycardia likely due to recent coffee intake (had 3 cups of coffee this morning).        Relevant Orders   EKG 12-Lead (Completed)    No orders of the defined types were placed in this encounter.   Follow-up: Return if symptoms worsen or fail to improve.    Lindell Spar, MD

## 2022-02-05 NOTE — Assessment & Plan Note (Signed)
Lipid profile reviewed Continue Crestor 

## 2022-02-05 NOTE — Assessment & Plan Note (Signed)
Chronic low back pain Has had steroid injections in the past, but does not want to get it now Followed by spine surgery Advised to avoid heavy lifting and frequent bending Tylenol as needed Heating pad and/or back brace as needed

## 2022-02-05 NOTE — Assessment & Plan Note (Addendum)
Planned to get lumbar spinal surgery for lumbar spinal stenosis Medically optimized for lumbar spinal surgery with moderate risk EKG: Sinus rhythm.  No signs of active ischemia. Tachycardia likely due to recent coffee intake (had 3 cups of coffee this morning).

## 2022-02-05 NOTE — Patient Instructions (Signed)
Please continue taking medications as prescribed.  Please continue to follow low salt diet and ambulate as tolerated. 

## 2022-02-05 NOTE — Assessment & Plan Note (Signed)
BP Readings from Last 1 Encounters:  02/05/22 118/82   Well-controlled with Olmesartan Counseled for compliance with the medications Advised DASH diet and moderate exercise/walking, at least 150 mins/week

## 2022-02-06 ENCOUNTER — Other Ambulatory Visit: Payer: Self-pay | Admitting: Neurosurgery

## 2022-02-12 ENCOUNTER — Encounter: Payer: Self-pay | Admitting: *Deleted

## 2022-02-17 ENCOUNTER — Other Ambulatory Visit: Payer: Self-pay | Admitting: Neurosurgery

## 2022-02-17 NOTE — Pre-Procedure Instructions (Signed)
Surgical Instructions    Your procedure is scheduled on February 25, 2022.  Report to [facility] Main Entrance "A" at 5:30 A.M., then check in with the Admitting office.  Call this number if you have problems the morning of surgery:  [phone]   If you have any questions prior to your surgery date call [phone]: Open Monday-Friday 8am-4pm    Remember:  Do not eat or drink after midnight the night before your surgery    Take these medicines the morning of surgery with A SIP OF WATER:  omeprazole (PRILOSEC OTC)  rosuvastatin (CRESTOR)   Take these medicines the morning of surgery AS NEEDED:  albuterol (VENTOLIN HFA) - Please bring inhaler with you to hospital  EPINEPHrine - please let hospital staff know if you took this medication morning of surgery  As of today, STOP taking any Aspirin (unless otherwise instructed by your surgeon) Aleve, Naproxen, Ibuprofen, Motrin, Advil, Goody's, BC's, all herbal medications, fish oil, and all vitamins.                     Do NOT Smoke (Tobacco/Vaping) for 24 hours prior to your procedure.  If you use a CPAP at night, you may bring your mask/headgear for your overnight stay.   Contacts, glasses, piercing's, hearing aid's, dentures or partials may not be worn into surgery, please bring cases for these belongings.    For patients admitted to the hospital, discharge time will be determined by your treatment team.   Patients discharged the day of surgery will not be allowed to drive home, and someone needs to stay with them for 24 hours.  SURGICAL WAITING ROOM VISITATION Patients having surgery or a procedure may have two support people in the waiting room. These visitors may be switched out with other visitors if needed. Children under the age of 37 must have an adult accompany them who is not the patient. If the patient needs to stay at the hospital during part of their recovery, the visitor guidelines for inpatient rooms apply.  Please  refer to the [facility] website for the visitor guidelines for Inpatients (after your surgery is over and you are in a regular room).    Special instructions:   [facility]- Preparing For Surgery  Before surgery, you can play an important role. Because skin is not sterile, your skin needs to be as free of germs as possible. You can reduce the number of germs on your skin by washing with CHG (chlorahexidine gluconate) Soap before surgery.  CHG is an antiseptic cleaner which kills germs and bonds with the skin to continue killing germs even after washing.    Oral Hygiene is also important to reduce your risk of infection.  Remember - BRUSH YOUR TEETH THE MORNING OF SURGERY WITH YOUR REGULAR TOOTHPASTE  Please do not use if you have an allergy to CHG or antibacterial soaps. If your skin becomes reddened/irritated stop using the CHG.  Do not shave (including legs and underarms) for at least 48 hours prior to first CHG shower. It is OK to shave your face.  Please follow these instructions carefully.   Shower the NIGHT BEFORE SURGERY and the MORNING OF SURGERY  If you chose to wash your hair, wash your hair first as usual with your normal shampoo.  After you shampoo, rinse your hair and body thoroughly to remove the shampoo.  Use CHG Soap as you would any other liquid soap. You can apply CHG directly to the skin  and wash gently with a scrungie or a clean washcloth.   Apply the CHG Soap to your body ONLY FROM THE NECK DOWN.  Do not use on open wounds or open sores. Avoid contact with your eyes, ears, mouth and genitals (private parts). Wash Face and genitals (private parts)  with your normal soap.   Wash thoroughly, paying special attention to the area where your surgery will be performed.  Thoroughly rinse your body with warm water from the neck down.  DO NOT shower/wash with your normal soap after using and rinsing off the CHG Soap.  Pat yourself dry with a CLEAN TOWEL.  Wear CLEAN  PAJAMAS to bed the night before surgery  Place CLEAN SHEETS on your bed the night before your surgery  DO NOT SLEEP WITH PETS.   Day of Surgery: Take a shower with CHG soap.  Do not wear jewelry or makeup Do not wear lotions, powders, perfumes/colognes, or deodorant. Do not shave 48 hours prior to surgery.  Men may shave face and neck. Do not bring valuables to the hospital.  [facility] is not responsible for any belongings or valuables.  Wear Clean/Comfortable clothing the morning of surgery  Remember to brush your teeth WITH YOUR REGULAR TOOTHPASTE.   Please read over the following fact sheets that you were given.    If you received a COVID test during your pre-op visit  it is requested that you wear a mask when out in public, stay away from anyone that may not be feeling well and notify your surgeon if you develop symptoms. If you have been in contact with anyone that has tested positive in the last 10 days please notify you surgeon.

## 2022-02-18 ENCOUNTER — Encounter (HOSPITAL_COMMUNITY): Payer: Self-pay

## 2022-02-18 ENCOUNTER — Other Ambulatory Visit: Payer: Self-pay

## 2022-02-18 ENCOUNTER — Encounter (HOSPITAL_COMMUNITY)
Admission: RE | Admit: 2022-02-18 | Discharge: 2022-02-18 | Disposition: A | Discharge status: Home / Self Care | Payer: Medicare HMO | Source: Ambulatory Visit | Attending: Neurosurgery | Admitting: Neurosurgery

## 2022-02-18 VITALS — BP 120/88 | HR 101 | Temp 97.9°F | Resp 17 | Ht 68.0 in | Wt 181.5 lb

## 2022-02-18 DIAGNOSIS — Z01812 Encounter for preprocedural laboratory examination: Secondary | ICD-10-CM | POA: Diagnosis present

## 2022-02-18 DIAGNOSIS — I10 Essential (primary) hypertension: Secondary | ICD-10-CM | POA: Insufficient documentation

## 2022-02-18 DIAGNOSIS — Z01818 Encounter for other preprocedural examination: Secondary | ICD-10-CM

## 2022-02-18 HISTORY — DX: Gastro-esophageal reflux disease without esophagitis: K21.9

## 2022-02-18 LAB — BASIC METABOLIC PANEL
Anion gap: 10 (ref 5–15)
BUN: 30 mg/dL — ABNORMAL HIGH (ref 8–23)
CO2: 23 mmol/L (ref 22–32)
Calcium: 10.3 mg/dL (ref 8.9–10.3)
Chloride: 105 mmol/L (ref 98–111)
Creatinine, Ser: 1.54 mg/dL — ABNORMAL HIGH (ref 0.61–1.24)
GFR, Estimated: 49 mL/min — ABNORMAL LOW (ref >60)
Glucose, Bld: 101 mg/dL — ABNORMAL HIGH (ref 70–99)
Potassium: 4.9 mmol/L (ref 3.5–5.1)
Sodium: 138 mmol/L (ref 135–145)

## 2022-02-18 LAB — CBC
HCT: 43.8 % (ref 39.0–52.0)
Hemoglobin: 15 g/dL (ref 13.0–17.0)
MCH: 31.2 pg (ref 26.0–34.0)
MCHC: 34.2 g/dL (ref 30.0–36.0)
MCV: 91.1 fL (ref 80.0–100.0)
Platelets: 246 K/uL (ref 150–400)
RBC: 4.81 MIL/uL (ref 4.22–5.81)
RDW: 13.9 % (ref 11.5–15.5)
WBC: 9.1 K/uL (ref 4.0–10.5)
nRBC: 0 % (ref 0.0–0.2)

## 2022-02-18 LAB — SURGICAL PCR SCREEN
MRSA, PCR: NEGATIVE
Staphylococcus aureus: NEGATIVE

## 2022-02-18 LAB — TYPE AND SCREEN
ABO/RH(D): A POS
Antibody Screen: NEGATIVE

## 2022-02-18 NOTE — Progress Notes (Signed)
This chaplain responded to RN consult and request for a pre-op notary.   The chaplain phoned the RN with education on notary and witness availability. [facility] is not able to provide the service at this time.  Chaplain Stephanie Acre [phone]

## 2022-02-18 NOTE — Progress Notes (Signed)
PCP - Dr. Trena Platt Cardiologist - denies  PPM/ICD - denies   Chest x-ray - denies EKG - 02/05/22 Stress Test - denies ECHO - denies Cardiac Cath - denies  Sleep Study - denies   DM- denies  ASA/Blood Thinner Instructions: n/a   ERAS Protcol - no, NPO   COVID TEST- n/a   Anesthesia review: no  Patient denies shortness of breath, fever, cough and chest pain at PAT appointment   All instructions explained to the patient, with a verbal understanding of the material. Patient agrees to go over the instructions while at home for a better understanding. Patient also instructed to notify surgeon of any contact with COVID + person or if he develops any symptoms. The opportunity to ask questions was provided.

## 2022-02-25 ENCOUNTER — Inpatient Hospital Stay (HOSPITAL_COMMUNITY): Payer: Medicare HMO

## 2022-02-25 ENCOUNTER — Other Ambulatory Visit: Payer: Self-pay

## 2022-02-25 ENCOUNTER — Inpatient Hospital Stay (HOSPITAL_COMMUNITY): Payer: Medicare HMO | Admitting: Anesthesiology

## 2022-02-25 ENCOUNTER — Inpatient Hospital Stay (HOSPITAL_COMMUNITY)
Admission: RE | Admit: 2022-02-25 | Discharge: 2022-02-26 | DRG: 455 | Disposition: A | Discharge status: Home / Self Care | Payer: Medicare HMO | Attending: Neurosurgery | Admitting: Neurosurgery

## 2022-02-25 ENCOUNTER — Encounter (HOSPITAL_COMMUNITY)
Admission: RE | Disposition: A | Discharge status: Home / Self Care | Payer: Self-pay | Source: Home / Self Care | Attending: Neurosurgery

## 2022-02-25 DIAGNOSIS — Z981 Arthrodesis status: Secondary | ICD-10-CM | POA: Diagnosis not present

## 2022-02-25 DIAGNOSIS — K219 Gastro-esophageal reflux disease without esophagitis: Secondary | ICD-10-CM | POA: Diagnosis present

## 2022-02-25 DIAGNOSIS — Z87891 Personal history of nicotine dependence: Secondary | ICD-10-CM | POA: Diagnosis not present

## 2022-02-25 DIAGNOSIS — Z8616 Personal history of COVID-19: Secondary | ICD-10-CM | POA: Diagnosis not present

## 2022-02-25 DIAGNOSIS — M5136 Other intervertebral disc degeneration, lumbar region: Secondary | ICD-10-CM | POA: Diagnosis not present

## 2022-02-25 DIAGNOSIS — Z9049 Acquired absence of other specified parts of digestive tract: Secondary | ICD-10-CM | POA: Diagnosis not present

## 2022-02-25 DIAGNOSIS — Z9081 Acquired absence of spleen: Secondary | ICD-10-CM | POA: Diagnosis not present

## 2022-02-25 DIAGNOSIS — M5416 Radiculopathy, lumbar region: Secondary | ICD-10-CM | POA: Diagnosis not present

## 2022-02-25 DIAGNOSIS — I10 Essential (primary) hypertension: Secondary | ICD-10-CM | POA: Diagnosis present

## 2022-02-25 DIAGNOSIS — M48061 Spinal stenosis, lumbar region without neurogenic claudication: Secondary | ICD-10-CM | POA: Diagnosis not present

## 2022-02-25 DIAGNOSIS — J302 Other seasonal allergic rhinitis: Secondary | ICD-10-CM | POA: Diagnosis present

## 2022-02-25 DIAGNOSIS — M2578 Osteophyte, vertebrae: Secondary | ICD-10-CM | POA: Diagnosis not present

## 2022-02-25 DIAGNOSIS — E782 Mixed hyperlipidemia: Secondary | ICD-10-CM | POA: Diagnosis present

## 2022-02-25 DIAGNOSIS — Z91030 Bee allergy status: Secondary | ICD-10-CM | POA: Diagnosis not present

## 2022-02-25 DIAGNOSIS — Z888 Allergy status to other drugs, medicaments and biological substances status: Secondary | ICD-10-CM

## 2022-02-25 DIAGNOSIS — M5116 Intervertebral disc disorders with radiculopathy, lumbar region: Secondary | ICD-10-CM | POA: Diagnosis present

## 2022-02-25 DIAGNOSIS — M4326 Fusion of spine, lumbar region: Secondary | ICD-10-CM | POA: Diagnosis not present

## 2022-02-25 DIAGNOSIS — M4316 Spondylolisthesis, lumbar region: Secondary | ICD-10-CM | POA: Diagnosis not present

## 2022-02-25 HISTORY — PX: TRANSFORAMINAL LUMBAR INTERBODY FUSION W/ MIS 2 LEVEL: SHX6146

## 2022-02-25 LAB — ABO/RH: ABO/RH(D): A POS

## 2022-02-25 SURGERY — MINIMALLY INVASIVE (MIS) TRANSFORAMINAL LUMBAR INTERBODY FUSION (TLIF) 2 LEVEL
Anesthesia: General

## 2022-02-25 MED ORDER — EPINEPHRINE 0.3 MG/0.3ML IJ SOAJ
0.3000 mg | INTRAMUSCULAR | Status: DC | PRN
  Filled 2022-02-25: qty 0.6

## 2022-02-25 MED ORDER — PROPOFOL 500 MG/50ML IV EMUL
INTRAVENOUS | Status: DC | PRN
  Administered 2022-02-25: 50 ug/kg/min via INTRAVENOUS

## 2022-02-25 MED ORDER — FENTANYL CITRATE (PF) 100 MCG/2ML IJ SOLN
INTRAMUSCULAR | Status: AC
  Filled 2022-02-25: qty 4

## 2022-02-25 MED ORDER — MENTHOL 3 MG MT LOZG: 1.0000 | LOZENGE | OROMUCOSAL | Status: DC | PRN

## 2022-02-25 MED ORDER — CEFAZOLIN SODIUM-DEXTROSE 2-4 GM/100ML-% IV SOLN: 2.0000 g | INTRAVENOUS | Status: DC

## 2022-02-25 MED ORDER — CHLORHEXIDINE GLUCONATE 0.12 % MT SOLN: 15.0000 mL | Freq: Once | OROMUCOSAL | Status: AC

## 2022-02-25 MED ORDER — CYCLOBENZAPRINE HCL 10 MG PO TABS
10.0000 mg | ORAL_TABLET | Freq: Three times a day (TID) | ORAL | Status: DC | PRN
  Administered 2022-02-25 – 2022-02-26 (×2): 10 mg via ORAL
  Filled 2022-02-25 (×2): qty 1

## 2022-02-25 MED ORDER — SODIUM CHLORIDE (PF) 0.9 % IJ SOLN
INTRAMUSCULAR | Status: DC | PRN
  Administered 2022-02-25: 10 mL

## 2022-02-25 MED ORDER — OXYCODONE HCL 5 MG PO TABS
ORAL_TABLET | ORAL | Status: AC
  Filled 2022-02-25: qty 1

## 2022-02-25 MED ORDER — CALCIUM-MAGNESIUM-ZINC PO TABS: 1.0000 | ORAL_TABLET | Freq: Every day | ORAL | Status: DC

## 2022-02-25 MED ORDER — POLYETHYLENE GLYCOL 3350 17 G PO PACK: 17.0000 g | PACK | Freq: Every day | ORAL | Status: DC | PRN

## 2022-02-25 MED ORDER — CHLORHEXIDINE GLUCONATE CLOTH 2 % EX PADS: 6.0000 | MEDICATED_PAD | Freq: Once | CUTANEOUS | Status: DC

## 2022-02-25 MED ORDER — ARTIFICIAL TEARS OPHTHALMIC OINT
TOPICAL_OINTMENT | OPHTHALMIC | Status: AC
  Filled 2022-02-25: qty 3.5

## 2022-02-25 MED ORDER — CEFAZOLIN SODIUM-DEXTROSE 2-3 GM-%(50ML) IV SOLR
INTRAVENOUS | Status: DC | PRN
  Administered 2022-02-25: 2 g via INTRAVENOUS

## 2022-02-25 MED ORDER — ALBUTEROL SULFATE (2.5 MG/3ML) 0.083% IN NEBU
2.5000 mg | INHALATION_SOLUTION | RESPIRATORY_TRACT | Status: DC | PRN
Start: 2022-02-25 — End: 2022-02-26

## 2022-02-25 MED ORDER — ROCURONIUM BROMIDE 10 MG/ML (PF) SYRINGE
PREFILLED_SYRINGE | INTRAVENOUS | Status: DC | PRN
  Administered 2022-02-25 (×2): 20 mg via INTRAVENOUS

## 2022-02-25 MED ORDER — ONDANSETRON HCL 4 MG/2ML IJ SOLN
INTRAMUSCULAR | Status: AC
  Filled 2022-02-25: qty 2

## 2022-02-25 MED ORDER — PHENOL 1.4 % MT LIQD: 1.0000 | OROMUCOSAL | Status: DC | PRN

## 2022-02-25 MED ORDER — 0.9 % SODIUM CHLORIDE (POUR BTL) OPTIME
TOPICAL | Status: DC | PRN
  Administered 2022-02-25: 1000 mL

## 2022-02-25 MED ORDER — LIDOCAINE 2% (20 MG/ML) 5 ML SYRINGE
INTRAMUSCULAR | Status: DC | PRN
  Administered 2022-02-25: 100 mg via INTRAVENOUS

## 2022-02-25 MED ORDER — SUCCINYLCHOLINE CHLORIDE 200 MG/10ML IV SOSY
PREFILLED_SYRINGE | INTRAVENOUS | Status: AC
  Filled 2022-02-25: qty 10

## 2022-02-25 MED ORDER — FLEET ENEMA 7-19 GM/118ML RE ENEM: 1.0000 | ENEMA | Freq: Once | RECTAL | Status: DC | PRN

## 2022-02-25 MED ORDER — SODIUM CHLORIDE 0.9% FLUSH: 3.0000 mL | INTRAVENOUS | Status: DC | PRN

## 2022-02-25 MED ORDER — LIDOCAINE 2% (20 MG/ML) 5 ML SYRINGE
INTRAMUSCULAR | Status: AC
  Filled 2022-02-25: qty 5

## 2022-02-25 MED ORDER — BUPIVACAINE HCL (PF) 0.5 % IJ SOLN
INTRAMUSCULAR | Status: DC | PRN
  Administered 2022-02-25: 30 mL

## 2022-02-25 MED ORDER — ORAL CARE MOUTH RINSE: 15.0000 mL | Freq: Once | OROMUCOSAL | Status: AC

## 2022-02-25 MED ORDER — CHLORHEXIDINE GLUCONATE 0.12 % MT SOLN
OROMUCOSAL | Status: AC
  Administered 2022-02-25: 15 mL via OROMUCOSAL
  Filled 2022-02-25: qty 15

## 2022-02-25 MED ORDER — SODIUM CHLORIDE 0.9% FLUSH: 3.0000 mL | Freq: Two times a day (BID) | INTRAVENOUS | Status: DC

## 2022-02-25 MED ORDER — FENTANYL CITRATE (PF) 100 MCG/2ML IJ SOLN
25.0000 ug | INTRAMUSCULAR | Status: DC | PRN
  Administered 2022-02-25 (×3): 50 ug via INTRAVENOUS

## 2022-02-25 MED ORDER — ONDANSETRON HCL 4 MG PO TABS: 4.0000 mg | ORAL_TABLET | Freq: Four times a day (QID) | ORAL | Status: DC | PRN

## 2022-02-25 MED ORDER — MIDAZOLAM HCL 2 MG/2ML IJ SOLN
INTRAMUSCULAR | Status: AC
  Filled 2022-02-25: qty 2

## 2022-02-25 MED ORDER — ROCURONIUM BROMIDE 10 MG/ML (PF) SYRINGE
PREFILLED_SYRINGE | INTRAVENOUS | Status: AC
  Filled 2022-02-25: qty 10

## 2022-02-25 MED ORDER — IRBESARTAN 150 MG PO TABS
150.0000 mg | ORAL_TABLET | Freq: Every day | ORAL | Status: DC
  Administered 2022-02-26: 150 mg via ORAL
  Filled 2022-02-25: qty 1

## 2022-02-25 MED ORDER — ACETAMINOPHEN 10 MG/ML IV SOLN
INTRAVENOUS | Status: AC
  Filled 2022-02-25: qty 100

## 2022-02-25 MED ORDER — SUCCINYLCHOLINE CHLORIDE 200 MG/10ML IV SOSY
PREFILLED_SYRINGE | INTRAVENOUS | Status: DC | PRN
  Administered 2022-02-25: 200 mg via INTRAVENOUS

## 2022-02-25 MED ORDER — PHENYLEPHRINE 80 MCG/ML (10ML) SYRINGE FOR IV PUSH (FOR BLOOD PRESSURE SUPPORT)
PREFILLED_SYRINGE | INTRAVENOUS | Status: DC | PRN
  Administered 2022-02-25 (×2): 160 ug via INTRAVENOUS
  Administered 2022-02-25: 240 ug via INTRAVENOUS

## 2022-02-25 MED ORDER — DOCUSATE SODIUM 100 MG PO CAPS
100.0000 mg | ORAL_CAPSULE | Freq: Two times a day (BID) | ORAL | Status: DC
  Administered 2022-02-25 – 2022-02-26 (×3): 100 mg via ORAL
  Filled 2022-02-25 (×3): qty 1

## 2022-02-25 MED ORDER — VITAMIN D 25 MCG (1000 UNIT) PO TABS
1000.0000 [IU] | ORAL_TABLET | Freq: Every day | ORAL | Status: DC
  Administered 2022-02-26: 1000 [IU] via ORAL
  Filled 2022-02-25: qty 1

## 2022-02-25 MED ORDER — SUGAMMADEX SODIUM 200 MG/2ML IV SOLN
INTRAVENOUS | Status: DC | PRN
  Administered 2022-02-25: 200 mg via INTRAVENOUS

## 2022-02-25 MED ORDER — FENTANYL CITRATE (PF) 250 MCG/5ML IJ SOLN
INTRAMUSCULAR | Status: AC
  Filled 2022-02-25: qty 5

## 2022-02-25 MED ORDER — CEFAZOLIN SODIUM-DEXTROSE 2-4 GM/100ML-% IV SOLN
2.0000 g | Freq: Three times a day (TID) | INTRAVENOUS | Status: AC
  Administered 2022-02-25 – 2022-02-26 (×2): 2 g via INTRAVENOUS
  Filled 2022-02-25 (×2): qty 100

## 2022-02-25 MED ORDER — ONDANSETRON HCL 4 MG/2ML IJ SOLN: 4.0000 mg | Freq: Four times a day (QID) | INTRAMUSCULAR | Status: DC | PRN

## 2022-02-25 MED ORDER — SODIUM CHLORIDE 0.9 % IV SOLN
250.0000 mL | INTRAVENOUS | Status: DC
  Administered 2022-02-25: 250 mL via INTRAVENOUS

## 2022-02-25 MED ORDER — CEFAZOLIN SODIUM-DEXTROSE 2-4 GM/100ML-% IV SOLN
INTRAVENOUS | Status: AC
  Filled 2022-02-25: qty 100

## 2022-02-25 MED ORDER — LACTATED RINGERS IV SOLN: INTRAVENOUS | Status: DC | PRN

## 2022-02-25 MED ORDER — OXYCODONE HCL 5 MG PO TABS: 5.0000 mg | ORAL_TABLET | ORAL | Status: DC | PRN

## 2022-02-25 MED ORDER — ONDANSETRON HCL 4 MG/2ML IJ SOLN
INTRAMUSCULAR | Status: DC | PRN
  Administered 2022-02-25: 4 mg via INTRAVENOUS

## 2022-02-25 MED ORDER — PHENYLEPHRINE 80 MCG/ML (10ML) SYRINGE FOR IV PUSH (FOR BLOOD PRESSURE SUPPORT)
PREFILLED_SYRINGE | INTRAVENOUS | Status: AC
  Filled 2022-02-25: qty 10

## 2022-02-25 MED ORDER — PANTOPRAZOLE SODIUM 40 MG PO TBEC
40.0000 mg | DELAYED_RELEASE_TABLET | Freq: Every day | ORAL | Status: DC
  Administered 2022-02-25 – 2022-02-26 (×2): 40 mg via ORAL
  Filled 2022-02-25 (×2): qty 1

## 2022-02-25 MED ORDER — THROMBIN 5000 UNITS EX SOLR
OROMUCOSAL | Status: DC | PRN
  Administered 2022-02-25: 5 mL via TOPICAL

## 2022-02-25 MED ORDER — POTASSIUM CHLORIDE IN NACL 20-0.9 MEQ/L-% IV SOLN: INTRAVENOUS | Status: DC

## 2022-02-25 MED ORDER — DEXAMETHASONE SODIUM PHOSPHATE 10 MG/ML IJ SOLN
INTRAMUSCULAR | Status: DC | PRN
  Administered 2022-02-25: 10 mg via INTRAVENOUS

## 2022-02-25 MED ORDER — DEXAMETHASONE SODIUM PHOSPHATE 10 MG/ML IJ SOLN
INTRAMUSCULAR | Status: AC
  Filled 2022-02-25: qty 1

## 2022-02-25 MED ORDER — HYDROMORPHONE HCL 1 MG/ML IJ SOLN
0.5000 mg | INTRAMUSCULAR | Status: DC | PRN
  Administered 2022-02-25: 0.5 mg via INTRAVENOUS
  Filled 2022-02-25: qty 0.5

## 2022-02-25 MED ORDER — THROMBIN 5000 UNITS EX SOLR
CUTANEOUS | Status: AC
  Filled 2022-02-25: qty 5000

## 2022-02-25 MED ORDER — ARTIFICIAL TEARS OPHTHALMIC OINT
TOPICAL_OINTMENT | OPHTHALMIC | Status: DC | PRN
  Administered 2022-02-25: 1 via OPHTHALMIC

## 2022-02-25 MED ORDER — OXYCODONE HCL 5 MG PO TABS
10.0000 mg | ORAL_TABLET | ORAL | Status: DC | PRN
  Administered 2022-02-25 – 2022-02-26 (×5): 10 mg via ORAL
  Filled 2022-02-25 (×5): qty 2

## 2022-02-25 MED ORDER — PROPOFOL 10 MG/ML IV BOLUS
INTRAVENOUS | Status: DC | PRN
  Administered 2022-02-25: 200 mg via INTRAVENOUS

## 2022-02-25 MED ORDER — OXYCODONE HCL 5 MG PO TABS
5.0000 mg | ORAL_TABLET | Freq: Once | ORAL | Status: AC | PRN
  Administered 2022-02-25: 5 mg via ORAL

## 2022-02-25 MED ORDER — BUPIVACAINE LIPOSOME 1.3 % IJ SUSP
INTRAMUSCULAR | Status: DC | PRN
  Administered 2022-02-25: 20 mL

## 2022-02-25 MED ORDER — ACETAMINOPHEN 650 MG RE SUPP: 650.0000 mg | RECTAL | Status: DC | PRN

## 2022-02-25 MED ORDER — ROSUVASTATIN CALCIUM 20 MG PO TABS
40.0000 mg | ORAL_TABLET | Freq: Every day | ORAL | Status: DC
  Administered 2022-02-26: 40 mg via ORAL
  Filled 2022-02-25: qty 2

## 2022-02-25 MED ORDER — LIDOCAINE-EPINEPHRINE 1 %-1:100000 IJ SOLN
INTRAMUSCULAR | Status: AC
  Filled 2022-02-25: qty 1

## 2022-02-25 MED ORDER — LIDOCAINE-EPINEPHRINE 1 %-1:100000 IJ SOLN
INTRAMUSCULAR | Status: DC | PRN
  Administered 2022-02-25 (×2): 10 mL

## 2022-02-25 MED ORDER — ACETAMINOPHEN 325 MG PO TABS
650.0000 mg | ORAL_TABLET | ORAL | Status: DC | PRN
  Administered 2022-02-25 – 2022-02-26 (×3): 650 mg via ORAL
  Filled 2022-02-25 (×3): qty 2

## 2022-02-25 MED ORDER — MIDAZOLAM HCL 2 MG/2ML IJ SOLN
INTRAMUSCULAR | Status: DC | PRN
  Administered 2022-02-25: 2 mg via INTRAVENOUS

## 2022-02-25 MED ORDER — ACETAMINOPHEN 325 MG PO TABS: 325.0000 mg | ORAL_TABLET | ORAL | Status: DC | PRN

## 2022-02-25 MED ORDER — FENTANYL CITRATE (PF) 100 MCG/2ML IJ SOLN
INTRAMUSCULAR | Status: AC
  Filled 2022-02-25: qty 2

## 2022-02-25 MED ORDER — ACETAMINOPHEN 10 MG/ML IV SOLN
INTRAVENOUS | Status: DC | PRN
  Administered 2022-02-25: 1000 mg via INTRAVENOUS

## 2022-02-25 MED ORDER — BUPIVACAINE HCL (PF) 0.5 % IJ SOLN
INTRAMUSCULAR | Status: AC
  Filled 2022-02-25: qty 30

## 2022-02-25 MED ORDER — PROPOFOL 10 MG/ML IV BOLUS
INTRAVENOUS | Status: AC
  Filled 2022-02-25: qty 20

## 2022-02-25 MED ORDER — ACETAMINOPHEN 160 MG/5ML PO SOLN: 325.0000 mg | ORAL | Status: DC | PRN

## 2022-02-25 MED ORDER — MEPERIDINE HCL 25 MG/ML IJ SOLN: 6.2500 mg | INTRAMUSCULAR | Status: DC | PRN

## 2022-02-25 MED ORDER — FENTANYL CITRATE (PF) 250 MCG/5ML IJ SOLN
INTRAMUSCULAR | Status: DC | PRN
  Administered 2022-02-25 (×2): 50 ug via INTRAVENOUS
  Administered 2022-02-25: 100 ug via INTRAVENOUS
  Administered 2022-02-25 (×4): 50 ug via INTRAVENOUS

## 2022-02-25 MED ORDER — OXYCODONE HCL 5 MG/5ML PO SOLN: 5.0000 mg | Freq: Once | ORAL | Status: AC | PRN

## 2022-02-25 MED ORDER — PHENYLEPHRINE HCL-NACL 20-0.9 MG/250ML-% IV SOLN
INTRAVENOUS | Status: DC | PRN
  Administered 2022-02-25: 20 ug/min via INTRAVENOUS

## 2022-02-25 MED ORDER — ONDANSETRON HCL 4 MG/2ML IJ SOLN: 4.0000 mg | Freq: Once | INTRAMUSCULAR | Status: DC | PRN

## 2022-02-25 MED ORDER — BUPIVACAINE LIPOSOME 1.3 % IJ SUSP
INTRAMUSCULAR | Status: AC
  Filled 2022-02-25: qty 20

## 2022-02-25 MED ORDER — LACTATED RINGERS IV SOLN: INTRAVENOUS | Status: DC

## 2022-02-25 SURGICAL SUPPLY — 99 items
BAG COUNTER SPONGE SURGICOUNT (BAG) ×3 IMPLANT
BAND RUBBER #18 3X1/16 STRL (MISCELLANEOUS) ×2 IMPLANT
BASKET BONE COLLECTION (BASKET) IMPLANT
BLADE CLIPPER SURG (BLADE) IMPLANT
BUR CARBIDE MATCH 3.0 (BURR) IMPLANT
BUR MATCHSTICK NEURO 3.0 LAGG (BURR) ×2 IMPLANT
BUR PRECISION FLUTE 5.0 (BURR) IMPLANT
CANISTER SUCT 3000ML PPV (MISCELLANEOUS) ×2 IMPLANT
CAP PUSHER PTP (ORTHOPEDIC DISPOSABLE SUPPLIES) ×1 IMPLANT
CLIP SPRING STIM LLIF SAFEOP (CLIP) ×1 IMPLANT
CNTNR URN SCR LID CUP LEK RST (MISCELLANEOUS) ×1 IMPLANT
CONT SPEC 4OZ STRL OR WHT (MISCELLANEOUS) ×1
COVER BACK TABLE 60X90IN (DRAPES) ×3 IMPLANT
DERMABOND ADHESIVE PROPEN (GAUZE/BANDAGES/DRESSINGS) ×2
DERMABOND ADVANCED (GAUZE/BANDAGES/DRESSINGS) ×1
DERMABOND ADVANCED .7 DNX12 (GAUZE/BANDAGES/DRESSINGS) ×1 IMPLANT
DERMABOND ADVANCED .7 DNX6 (GAUZE/BANDAGES/DRESSINGS) IMPLANT
DILATOR INSULATED LLIF 8-13-18 (NEUROSURGERY SUPPLIES) ×1 IMPLANT
DRAIN JACKSON PRATT 10MM FLAT (MISCELLANEOUS) IMPLANT
DRAPE 3/4 80X56 (DRAPES) ×2 IMPLANT
DRAPE C-ARM 42X72 X-RAY (DRAPES) ×2 IMPLANT
DRAPE C-ARMOR (DRAPES) ×3 IMPLANT
DRAPE INCISE IOBAN 66X45 STRL (DRAPES) ×1 IMPLANT
DRAPE LAPAROTOMY 100X72X124 (DRAPES) ×2 IMPLANT
DRAPE MICROSCOPE LEICA (MISCELLANEOUS) ×1 IMPLANT
DRSG OPSITE POSTOP 4X6 (GAUZE/BANDAGES/DRESSINGS) ×2 IMPLANT
DURAPREP 26ML APPLICATOR (WOUND CARE) ×3 IMPLANT
ELECT BLADE INSULATED 6.5IN (ELECTROSURGICAL) ×2
ELECT KIT SAFEOP SSEP/SURF (KITS) ×2
ELECT REM PT RETURN 9FT ADLT (ELECTROSURGICAL) ×2
ELECTRODE BLDE INSULATED 6.5IN (ELECTROSURGICAL) ×1 IMPLANT
ELECTRODE KT SAFEOP SSEP/SURF (KITS) IMPLANT
ELECTRODE REM PT RTRN 9FT ADLT (ELECTROSURGICAL) ×1 IMPLANT
EVACUATOR SILICONE 100CC (DRAIN) IMPLANT
EXTENDER BLADE LIF PTP 26 (INSTRUMENTS) ×1 IMPLANT
FORCEPS LIF BIPOLAR STL (ORTHOPEDIC DISPOSABLE SUPPLIES) ×1 IMPLANT
GAUZE 4X4 16PLY ~~LOC~~+RFID DBL (SPONGE) IMPLANT
GAUZE SPONGE 4X4 12PLY STRL (GAUZE/BANDAGES/DRESSINGS) IMPLANT
GLOVE BIOGEL PI IND STRL 7.5 (GLOVE) ×1 IMPLANT
GLOVE BIOGEL PI INDICATOR 7.5 (GLOVE) ×3
GLOVE ECLIPSE 7.5 STRL STRAW (GLOVE) ×3 IMPLANT
GLOVE EXAM NITRILE XL STR (GLOVE) IMPLANT
GLOVE SURG ENC MOIS LTX SZ8 (GLOVE) ×2 IMPLANT
GLOVE SURG UNDER POLY LF SZ8.5 (GLOVE) ×2 IMPLANT
GOWN STRL REUS W/ TWL LRG LVL3 (GOWN DISPOSABLE) ×1 IMPLANT
GOWN STRL REUS W/ TWL XL LVL3 (GOWN DISPOSABLE) ×2 IMPLANT
GOWN STRL REUS W/TWL 2XL LVL3 (GOWN DISPOSABLE) IMPLANT
GOWN STRL REUS W/TWL LRG LVL3 (GOWN DISPOSABLE) ×1
GOWN STRL REUS W/TWL XL LVL3 (GOWN DISPOSABLE) ×4
GUIDEWIRE LLIF TT 310 (WIRE) ×2 IMPLANT
GUIDEWIRE TROC TIP NIT 25 (WIRE) ×6 IMPLANT
HEMOSTAT POWDER KIT SURGIFOAM (HEMOSTASIS) ×2 IMPLANT
KIT BASIN OR (CUSTOM PROCEDURE TRAY) ×2 IMPLANT
KIT INFUSE XX SMALL 0.7CC (Orthopedic Implant) ×2 IMPLANT
KIT TURNOVER KIT B (KITS) ×2 IMPLANT
KNIFE ANNULOTOMY (BLADE) ×1 IMPLANT
LIF ILLUMINATION SYSTEM STERIL (SYSTAGENIX WOUND MANAGEMENT) ×2
MARKER SKIN DUAL TIP RULER LAB (MISCELLANEOUS) ×3 IMPLANT
MATRIX SPINE STRIP NEOCORE 5CC (Putty) IMPLANT
MILL BONE PREP (MISCELLANEOUS) ×1 IMPLANT
MODULE POSITIONING LIF 2.0 (INSTRUMENTS) ×1 IMPLANT
NDL BEVEL TWO-PAK W/1PK (NEEDLE) IMPLANT
NDL HYPO 18GX1.5 BLUNT FILL (NEEDLE) IMPLANT
NDL HYPO 21X1.5 SAFETY (NEEDLE) IMPLANT
NDL SPNL 18GX3.5 QUINCKE PK (NEEDLE) IMPLANT
NEEDLE BEVEL TWO-PAK W/1PK (NEEDLE) ×4 IMPLANT
NEEDLE HYPO 18GX1.5 BLUNT FILL (NEEDLE) IMPLANT
NEEDLE HYPO 21X1.5 SAFETY (NEEDLE) IMPLANT
NEEDLE HYPO 22GX1.5 SAFETY (NEEDLE) ×2 IMPLANT
NEEDLE SPNL 18GX3.5 QUINCKE PK (NEEDLE) IMPLANT
NS IRRIG 1000ML POUR BTL (IV SOLUTION) ×2 IMPLANT
PACK LAMINECTOMY NEURO (CUSTOM PROCEDURE TRAY) ×2 IMPLANT
PAD ARMBOARD 7.5X6 YLW CONV (MISCELLANEOUS) ×6 IMPLANT
PROBE BALL TIP LLIF SAFEOP (NEUROSURGERY SUPPLIES) ×1 IMPLANT
ROD LORD MIS TI 5.5X75 (Rod) ×1 IMPLANT
ROD LORD MIS TI 5.5X80 (Rod) ×1 IMPLANT
SCREW CANN 55X6.5 (Screw) IMPLANT
SCREW CANN FENESTRA 6.5X50 (Screw) ×1 IMPLANT
SCREW CANN FENS 6.5X55 (Screw) ×1 IMPLANT
SCREW CANN FENS 7.5X45 (Screw) ×2 IMPLANT
SCREW CANN FENS 7.5X55 (Screw) ×2 IMPLANT
SET SCREW (Screw) ×6 IMPLANT
SET SCREW SPNE (Screw) IMPLANT
SPACER IDENTITI 8X18X60 10D (Spacer) ×2 IMPLANT
SPIKE FLUID TRANSFER (MISCELLANEOUS) ×1 IMPLANT
SPONGE SURGIFOAM ABS GEL 100 (HEMOSTASIS) IMPLANT
SPONGE T-LAP 4X18 ~~LOC~~+RFID (SPONGE) IMPLANT
STAPLER VISISTAT 35W (STAPLE) ×1 IMPLANT
STRIP MATRIX NEOCORE 5CC (Putty) ×2 IMPLANT
SUT MNCRL AB 4-0 PS2 18 (SUTURE) ×3 IMPLANT
SUT VIC AB 0 CT1 18XCR BRD8 (SUTURE) ×1 IMPLANT
SUT VIC AB 0 CT1 8-18 (SUTURE) ×2
SUT VIC AB 2-0 CP2 18 (SUTURE) ×5 IMPLANT
SYR 30ML LL (SYRINGE) ×3 IMPLANT
SYSTEM ILLUMINATION LIF STERIL (SYSTAGENIX WOUND MANAGEMENT) IMPLANT
TOWEL GREEN STERILE (TOWEL DISPOSABLE) ×2 IMPLANT
TOWEL GREEN STERILE FF (TOWEL DISPOSABLE) ×2 IMPLANT
TRAY FOLEY MTR SLVR 16FR STAT (SET/KITS/TRAYS/PACK) ×2 IMPLANT
WATER STERILE IRR 1000ML POUR (IV SOLUTION) ×2 IMPLANT

## 2022-02-25 NOTE — Progress Notes (Signed)
Orthopedic Tech Progress Note Patient Details:  Kyle Jarvis 01/01/1956 [id number]  Ortho Devices Type of Ortho Device: Lumbar corsett Ortho Device/Splint Location: BACK Ortho Device/Splint Interventions: Ordered   Post Interventions Patient Tolerated: Well Instructions Provided: Care of device  Donald Pore 02/25/2022, 5:04 PM

## 2022-02-26 MED ORDER — METHOCARBAMOL 750 MG PO TABS: 750.0000 mg | ORAL_TABLET | Freq: Four times a day (QID) | ORAL | 3 refills | Status: DC | PRN

## 2022-02-26 MED ORDER — OXYCODONE-ACETAMINOPHEN 5-325 MG PO TABS: 1.0000 | ORAL_TABLET | ORAL | 0 refills | Status: DC | PRN

## 2022-02-26 NOTE — Anesthesia Postprocedure Evaluation (Signed)
Anesthesia Post Note  Patient: Kyle Jarvis  Procedure(s) Performed: LATERAL LUMBAR INTERBODY FUSION, LEFT  LUMBAR THREE-FOUR, LUMBAR FOUR-FIVE, PRONE TRANSPSOAS, PERCUTANEOUS PEDICLE SCREW FIXATION     Anesthesia Type: General Anesthetic complications: no   No notable events documented.  Last Vitals:  Vitals:   02/26/22 0428 02/26/22 0722  BP: 107/76 108/76  Pulse: 88 92  Resp: 20 18  Temp: 37 C 36.6 C  SpO2: 93% 96%    Last Pain:  Vitals:   02/26/22 0931  TempSrc:   PainSc: 4                  ,

## 2022-02-26 NOTE — Evaluation (Signed)
Occupational Therapy Evaluation Patient Details Name: Kyle Jarvis. Kyle Jarvis MRN: [id number] DOB: 1956/01/28 Today's Date: 02/26/2022   History of Present Illness Pt is a 66 y/o M s/p L lateral lumbar interbody fusion L3-4, L4-5. PMH includes allergies, GERD, and HTN.   Clinical Impression   Pt independent at baseline with ADLs, uses cane in community. Pt lives with spouse who can provide assist at d/c. Pt currently needing mod I -supervision for ADLs, mod I for bed mobility, and supervision for transfers. Pt educated on precautions, brace wear, and compensatory strategies for ADLs, pt verbalized and demo's understanding during session. Pt presenting with impairments listed below, however has no OT needs at this time, will s/o. Recommend d/c home with family assistance.     Recommendations for follow up therapy are one component of a multi-disciplinary discharge planning process, led by the attending physician.  Recommendations may be updated based on patient status, additional functional criteria and insurance authorization.   Follow Up Recommendations  No OT follow up    Assistance Recommended at Discharge Intermittent Supervision/Assistance  Patient can return home with the following A little help with bathing/dressing/bathroom;Assistance with cooking/housework;Help with stairs or ramp for entrance;Assist for transportation    Functional Status Assessment  Patient has had a recent decline in their functional status and demonstrates the ability to make significant improvements in function in a reasonable and predictable amount of time.  Equipment Recommendations  None recommended by OT (pt has all needed DME)    Recommendations for Other Services PT consult     Precautions / Restrictions Precautions Precautions: Back Precaution Booklet Issued: Yes (comment) Precaution Comments: reviewed 3/3 back precautions Required Braces or Orthoses: Spinal Brace Spinal Brace: Applied in sitting  position;Applied in standing position;Lumbar corset Restrictions Weight Bearing Restrictions: No      Mobility Bed Mobility Overal bed mobility: Modified Independent             General bed mobility comments: use of log rolling technique    Transfers Overall transfer level: Needs assistance Equipment used: Rolling walker (2 wheels) Transfers: Sit to/from Stand Sit to Stand: Supervision                  Balance Overall balance assessment: Mild deficits observed, not formally tested                                         ADL either performed or assessed with clinical judgement   ADL Overall ADL's : Needs assistance/impaired Eating/Feeding: Modified independent   Grooming: Modified independent;Standing   Upper Body Bathing: Supervision/ safety;Standing   Lower Body Bathing: Supervison/ safety;Sit to/from stand   Upper Body Dressing : Supervision/safety;Sitting   Lower Body Dressing: Sitting/lateral leans;Supervision/safety   Toilet Transfer: Ambulation;Rolling walker (2 wheels);Supervision/safety   Toileting- Architect and Hygiene: Supervision/safety;Sitting/lateral lean;Sit to/from stand       Functional mobility during ADLs: Supervision/safety;Rolling walker (2 wheels)       Vision Baseline Vision/History: 1 Wears glasses Vision Assessment?: No apparent visual deficits     Perception     Praxis      Pertinent Vitals/Pain Pain Assessment Pain Assessment: Faces Pain Score: 6  Faces Pain Scale: Hurts even more Pain Location: back at incision Pain Descriptors / Indicators: Discomfort Pain Intervention(s): Limited activity within patient's tolerance, Monitored during session, Repositioned     Hand Dominance     Extremity/Trunk Assessment Upper  Extremity Assessment Upper Extremity Assessment: Overall WFL for tasks assessed   Lower Extremity Assessment Lower Extremity Assessment: Defer to PT evaluation    Cervical / Trunk Assessment Cervical / Trunk Assessment: Back Surgery   Communication Communication Communication: No difficulties   Cognition Arousal/Alertness: Awake/alert Behavior During Therapy: WFL for tasks assessed/performed Overall Cognitive Status: Within Functional Limits for tasks assessed                                       General Comments  VSS on RA    Exercises     Shoulder Instructions      Home Living Family/patient expects to be discharged to:: Private residence Living Arrangements: Spouse/significant other Available Help at Discharge: Family Type of Home: House Home Access: Level entry     Home Layout: One level     Bathroom Shower/Tub: Walk-in shower         Home Equipment: Agricultural consultant (2 wheels);Shower seat;Cane - single point          Prior Functioning/Environment Prior Level of Function : Independent/Modified Independent             Mobility Comments: cane in community ADLs Comments: ind        OT Problem List: Decreased range of motion;Decreased strength;Impaired balance (sitting and/or standing)      OT Treatment/Interventions:      OT Goals(Current goals can be found in the care plan section) Acute Rehab OT Goals Patient Stated Goal: none stated OT Goal Formulation: With patient Time For Goal Achievement: 03/12/22 Potential to Achieve Goals: Good  OT Frequency:      Co-evaluation              AM-PAC OT "6 Clicks" Daily Activity     Outcome Measure Help from another person eating meals?: None Help from another person taking care of personal grooming?: None Help from another person toileting, which includes using toliet, bedpan, or urinal?: None Help from another person bathing (including washing, rinsing, drying)?: A Little Help from another person to put on and taking off regular upper body clothing?: None Help from another person to put on and taking off regular lower body clothing?: A  Little 6 Click Score: 22   End of Session Equipment Utilized During Treatment: Rolling walker (2 wheels) Nurse Communication: Mobility status  Activity Tolerance: Patient tolerated treatment well Patient left: in bed;with call bell/phone within reach  OT Visit Diagnosis: Unsteadiness on feet (R26.81);Other abnormalities of gait and mobility (R26.89);Muscle weakness (generalized) (M62.81)                Time: 1610-9604 OT Time Calculation (min): 19 min Charges:  OT General Charges $OT Visit: 1 Visit OT Evaluation $OT Eval Low Complexity: 1 Low  Alfonzo Beers, OTD, OTR/L Acute Rehab [phone]) [phone]   Mayer Masker 02/26/2022, 9:19 AM

## 2022-02-26 NOTE — Discharge Instructions (Signed)
Wound Care REMOVE DRESSING IN 3 DAYS Leave incision open to air. You may shower. Do not scrub directly on incision.  Do not put any creams, lotions, or ointments on incision. Activity Walk each and every day, increasing distance each day. No lifting greater than 5 lbs.  Avoid bending, arching, and twisting. No driving for 2 weeks; may ride as a passenger locally. If provided with back brace, wear when out of bed.  It is not necessary to wear in bed. Diet Resume your normal diet.  Return to Work Will be discussed at you follow up appointment. Call Your Doctor If Any of These Occur Redness, drainage, or swelling at the wound.  Temperature greater than 101 degrees. Severe pain not relieved by pain medication. Incision starts to come apart. Follow Up Appt Call weeks [phone])  for problems.  If you have any hardware placed in your spine, you will need an x-ray before your appointment.

## 2022-02-26 NOTE — Evaluation (Signed)
Physical Therapy Evaluation & Discharge Patient Details Name: Kyle Jarvis. Wadhwa MRN: [id number] DOB: 16-Aug-1956 Today's Date: 02/26/2022  History of Present Illness  Pt is a 66 y/o M s/p L lateral lumbar interbody fusion L3-4, L4-5. PMH includes allergies, GERD, and HTN.  Clinical Impression  Pt admitted with above diagnosis. Wife and daughter were in the room during the session, and pt was pleasant and agreeable to mobility. PTA, pt was independent with mobility and ADLs. Pt lives with wife in one-level home which is wheelchair accessible, with no stairs to enter. Pt has help from family PRN upon discharge and all necessary DME. Upon PT eval, pt demonstrated the ability to complete bed mobility, transfers, and ambulation with mod I with RW. Provided pt education on spinal precautions, car transfers, and post-op mobility recommendations. He is currently safe to d/c home from a PT perspective and anticipates d/c home this afternoon. Will sign off, please reconsult if needs change.      Recommendations for follow up therapy are one component of a multi-disciplinary discharge planning process, led by the attending physician.  Recommendations may be updated based on patient status, additional functional criteria and insurance authorization.  Follow Up Recommendations No PT follow up      Assistance Recommended at Discharge PRN  Patient can return home with the following  A little help with walking and/or transfers;A little help with bathing/dressing/bathroom;Assist for transportation;Help with stairs or ramp for entrance;Assistance with cooking/housework    Equipment Recommendations None recommended by PT  Recommendations for Other Services       Functional Status Assessment Patient has had a recent decline in their functional status and demonstrates the ability to make significant improvements in function in a reasonable and predictable amount of time.     Precautions / Restrictions  Precautions Precautions: Back;Fall Precaution Booklet Issued: Yes (comment) Precaution Comments: spinal precautions Required Braces or Orthoses: Spinal Brace Spinal Brace: Applied in sitting position;Lumbar corset Restrictions Weight Bearing Restrictions: No      Mobility  Bed Mobility Overal bed mobility: Modified Independent             General bed mobility comments: use of log rolling technique, transferred back to bed post ambulation    Transfers Overall transfer level: Modified independent Equipment used: Rolling walker (2 wheels) Transfers: Sit to/from Stand Sit to Stand: Modified independent (Device/Increase time)           General transfer comment: from EOB x2, mod I with RW    Ambulation/Gait Ambulation/Gait assistance: Modified independent (Device/Increase time) Gait Distance (Feet): 500 Feet Assistive device: Rolling walker (2 wheels) Gait Pattern/deviations: Step-through pattern, Wide base of support, Trunk flexed, Decreased stride length Gait velocity: decreased Gait velocity interpretation: <1.8 ft/sec, indicate of risk for recurrent falls   General Gait Details: pt with slow and steady gait, mod I with use of RW for stability  Stairs            Wheelchair Mobility    Modified Rankin (Stroke Patients Only)       Balance Overall balance assessment: Needs assistance Sitting-balance support: Feet supported, No upper extremity supported Sitting balance-Leahy Scale: Good     Standing balance support: Single extremity supported Standing balance-Leahy Scale: Fair Standing balance comment: reliant on at least 1 UE support while standing                             Pertinent Vitals/Pain Pain Assessment Pain Assessment:  Faces Faces Pain Scale: Hurts little more Pain Location: back at incision Pain Descriptors / Indicators: Discomfort, Sore Pain Intervention(s): Monitored during session, Repositioned    Home Living  Family/patient expects to be discharged to:: Private residence Living Arrangements: Spouse/significant other Available Help at Discharge: Family Type of Home: House Home Access: Level entry       Home Layout: One level Home Equipment: Agricultural consultant (2 wheels);Shower seat;Cane - single point      Prior Function Prior Level of Function : Independent/Modified Independent             Mobility Comments: cane in community ADLs Comments: independent, lives with wife     Hand Dominance        Extremity/Trunk Assessment   Upper Extremity Assessment Upper Extremity Assessment: Defer to OT evaluation    Lower Extremity Assessment Lower Extremity Assessment: Generalized weakness    Cervical / Trunk Assessment Cervical / Trunk Assessment: Back Surgery  Communication   Communication: No difficulties  Cognition Arousal/Alertness: Awake/alert Behavior During Therapy: WFL for tasks assessed/performed Overall Cognitive Status: Within Functional Limits for tasks assessed                                          General Comments General comments (skin integrity, edema, etc.): VSS on RA    Exercises     Assessment/Plan    PT Assessment Patient does not need any further PT services  PT Problem List Decreased strength;Decreased activity tolerance;Decreased balance;Decreased mobility;Pain       PT Treatment Interventions      PT Goals (Current goals can be found in the Care Plan section)  Acute Rehab PT Goals Patient Stated Goal: to go home PT Goal Formulation: With patient Time For Goal Achievement: 03/12/22 Potential to Achieve Goals: Good    Frequency       Co-evaluation               AM-PAC PT "6 Clicks" Mobility  Outcome Measure Help needed turning from your back to your side while in a flat bed without using bedrails?: None Help needed moving from lying on your back to sitting on the side of a flat bed without using bedrails?:  None Help needed moving to and from a bed to a chair (including a wheelchair)?: None Help needed standing up from a chair using your arms (e.g., wheelchair or bedside chair)?: None Help needed to walk in hospital room?: None Help needed climbing 3-5 steps with a railing? : A Little 6 Click Score: 23    End of Session Equipment Utilized During Treatment: Gait belt;Back brace Activity Tolerance: Patient tolerated treatment well;No increased pain Patient left: in bed;with call bell/phone within reach;with family/visitor present Nurse Communication: Mobility status PT Visit Diagnosis: Unsteadiness on feet (R26.81);Other abnormalities of gait and mobility (R26.89);Pain Pain - Right/Left: Left Pain - part of body: Leg (lower back)    Time: 2355-7322 PT Time Calculation (min) (ACUTE ONLY): 18 min   Charges:   PT Evaluation $PT Eval Low Complexity: 1 Low          Melvyn Novas, MS, [place] Acute Rehabilitation Services Office: [phone]  Melvyn Novas 02/26/2022, 11:02 AM

## 2022-02-26 NOTE — Progress Notes (Signed)
Patient alert and oriented, voiding adequately, skin clean, dry and intact without evidence of skin break down, or symptoms of complications - no redness or edema noted, only slight tenderness at site.  Patient states pain is manageable at time of discharge. Patient has an appointment with MD in 2 weeks 

## 2022-02-26 NOTE — Discharge Summary (Addendum)
Physician Discharge Summary  Patient ID: Kyle Jarvis. Kyle Jarvis MRN: [id number] DOB/AGE: 67-17-1957 67 y.o.  Admit date: 02/25/2022 Discharge date: 02/26/2022  Admission Diagnoses: Lumbar spinal stenosis, lumbago, lumbar radiculopathy  Discharge Diagnoses: Lumbar spinal stenosis, lumbago, lumbar radiculopathy Principal Problem:   Lumbar stenosis   Discharged Condition: good  Hospital Course: The patient was admitted on 02/25/2022 and taken to the operating room where the patient underwent L3-4 and L4-5 R DLIF. The patient tolerated the procedure well and was taken to the recovery room and then to the floor in stable condition. The hospital course was routine. There were no complications. The wound remained clean dry and intact. Pt had appropriate back soreness. No complaints of leg pain or new N/T/W. The patient remained afebrile with stable vital signs, and tolerated a regular diet. The patient continued to increase activities, and pain was well controlled with oral pain medications.   Consults: None  Significant Diagnostic Studies: radiology: X-Ray: intraoperative   Treatments: surgery: Lateral lumbar interbody fusion, left  lumbar three-four, lumbar four-five, prone transpsoas, percutaneous pedicle screw fixation  Discharge Exam: Blood pressure 107/76, pulse 88, temperature 98.6 F (37 C), temperature source Oral, resp. rate 20, height 5\' 8"  (1.727 m), weight 81.6 kg, SpO2 93 %. Physical Exam: Patient is awake, A/O X 4, conversant, and in good spirits. Eyes open spontaneously. They are in NAD and VSS. Doing well. Speech is fluent and appropriate. MAEW with good strength that is symmetric bilaterally.  BUE 5/5 throughout, BLE 5/5 throughout. Sensation to light touch is intact. PERLA, EOMI. CNs grossly intact. Dressing is clean dry intact. Incision is well approximated with no drainage, erythema, or edema.    Disposition: Discharge disposition: 01-Home or Self Care       Discharge  Instructions     Incentive spirometry RT   Complete by: As directed       Allergies as of 02/26/2022       Reactions   Bee Venom Anaphylaxis   Hydrocortisone Swelling   Had back injection, and he had to be wheelchair bound for 3 months   Pollen Extract    Hay Fever        Medication List     TAKE these medications    albuterol 108 (90 Base) MCG/ACT inhaler Commonly known as: VENTOLIN HFA Inhale 2 puffs into the lungs every 4 (four) hours as needed for wheezing or shortness of breath.   Calcium-Magnesium-Zinc Tabs Take 1 tablet by mouth daily.   cholecalciferol 25 MCG (1000 UNIT) tablet Commonly known as: VITAMIN D3 Take 1,000 Units by mouth daily.   COLLAGEN PO Take 2 capsules by mouth daily.   EPINEPHrine 0.3 mg/0.3 mL Soaj injection Commonly known as: EPI-PEN Inject 0.3 mg into the muscle as needed for anaphylaxis.   methocarbamol 750 MG tablet Commonly known as: Robaxin-750 Take 1 tablet (750 mg total) by mouth every 6 (six) hours as needed for muscle spasms.   olmesartan 20 MG tablet Commonly known as: BENICAR Take 1 tablet (20 mg total) by mouth daily.   omeprazole 20 MG tablet Commonly known as: PRILOSEC OTC Take 20 mg by mouth daily.   ORAL ELECTROLYTES PO Take 1 capsule by mouth daily.   oxyCODONE-acetaminophen 5-325 MG tablet Commonly known as: Percocet Take 1-2 tablets by mouth every 4 (four) hours as needed for severe pain.   rosuvastatin 40 MG tablet Commonly known as: CRESTOR Take 1 tablet (40 mg total) by mouth daily.   sildenafil 100 MG tablet Commonly known  as: VIAGRA Take 1 tablet (100 mg total) by mouth daily as needed for erectile dysfunction.         Signed: Council Mechanic, DNP, AGNP-C Neurosurgery Nurse Practitioner  [facility] [address], [place], [place] [address] P: [phone]    F: [phone]  02/26/2022 7:16 AM

## 2022-02-26 NOTE — Plan of Care (Signed)

## 2022-02-27 ENCOUNTER — Telehealth: Payer: Self-pay

## 2022-02-27 ENCOUNTER — Encounter (HOSPITAL_COMMUNITY): Payer: Self-pay | Admitting: Neurosurgery

## 2022-02-27 NOTE — Telephone Encounter (Signed)
Transition Care Management Follow-up Telephone Call Date of discharge and from where: [facility] 02/26/22 How have you been since you were released from the hospital? Good  Any questions or concerns? Yes  Items Reviewed: Did the pt receive and understand the discharge instructions provided? Yes  Medications obtained and verified? Yes  Other?  N/a Any new allergies since your discharge? No  Dietary orders reviewed? Yes Do you have support at home? Yes   Home Care and Equipment/Supplies: Were home health services ordered? no If so, what is the name of the agency? N/a  Has the agency set up a time to come to the patient's home? not applicable Were any new equipment or medical supplies ordered?  No What is the name of the medical supply agency? N/a Were you able to get the supplies/equipment? not applicable Do you have any questions related to the use of the equipment or supplies? No  Functional Questionnaire: (I = Independent and D = Dependent) ADLs: i  Bathing/Dressing- i  Meal Prep- i  Eating- i  Maintaining continence- i  Transferring/Ambulation- i  Managing Meds- i  Follow up appointments reviewed:  PCP Hospital f/u appt confirmed? No  Patient Declined appt Specialist Hospital f/u appt confirmed? Yes  Scheduled to see Dr.Thomas 03/12/22 Are transportation arrangements needed? No  If their condition worsens, is the pt aware to call PCP or go to the Emergency Dept.? Yes Was the patient provided with contact information for the PCP's office or ED? Yes Was to pt encouraged to call back with questions or concerns? Yes

## 2022-02-28 ENCOUNTER — Encounter (HOSPITAL_COMMUNITY): Payer: Self-pay | Admitting: Neurosurgery

## 2022-04-23 DIAGNOSIS — M5416 Radiculopathy, lumbar region: Secondary | ICD-10-CM | POA: Diagnosis not present

## 2022-05-26 DIAGNOSIS — M5416 Radiculopathy, lumbar region: Secondary | ICD-10-CM | POA: Diagnosis not present

## 2022-05-27 ENCOUNTER — Ambulatory Visit (INDEPENDENT_AMBULATORY_CARE_PROVIDER_SITE_OTHER): Payer: Medicare HMO | Admitting: Internal Medicine

## 2022-05-27 ENCOUNTER — Encounter: Payer: Self-pay | Admitting: Internal Medicine

## 2022-05-27 VITALS — BP 138/84 | HR 82 | Resp 18 | Ht 68.0 in | Wt 189.4 lb

## 2022-05-27 DIAGNOSIS — Z2821 Immunization not carried out because of patient refusal: Secondary | ICD-10-CM | POA: Diagnosis not present

## 2022-05-27 DIAGNOSIS — I10 Essential (primary) hypertension: Secondary | ICD-10-CM

## 2022-05-27 DIAGNOSIS — Z0001 Encounter for general adult medical examination with abnormal findings: Secondary | ICD-10-CM | POA: Diagnosis not present

## 2022-05-27 DIAGNOSIS — Z125 Encounter for screening for malignant neoplasm of prostate: Secondary | ICD-10-CM | POA: Insufficient documentation

## 2022-05-27 DIAGNOSIS — E782 Mixed hyperlipidemia: Secondary | ICD-10-CM | POA: Diagnosis not present

## 2022-05-27 DIAGNOSIS — Z131 Encounter for screening for diabetes mellitus: Secondary | ICD-10-CM

## 2022-05-27 NOTE — Assessment & Plan Note (Signed)
BP Readings from Last 1 Encounters:  05/27/22 138/84   Well-controlled with Olmesartan Counseled for compliance with the medications Advised DASH diet and moderate exercise/walking, at least 150 mins/week

## 2022-05-27 NOTE — Assessment & Plan Note (Signed)
Continue Crestor Check lipid profile 

## 2022-05-27 NOTE — Assessment & Plan Note (Signed)
Ordered PSA after discussing its limitations for prostate cancer screening, including false positive results leading additional investigations. 

## 2022-05-27 NOTE — Assessment & Plan Note (Signed)
Physical exam as documented. Fasting blood tests ordered. Denies flu vaccine. Advised to get Shingrix and Tdap vaccine at local pharmacy.

## 2022-05-27 NOTE — Patient Instructions (Signed)
Please continue taking medications as prescribed.  Please continue to follow low salt diet and ambulate as tolerated.  Please check BP at least 3 times in a week and contact if it remains above 140/90 on 3 consecutive measurements.

## 2022-05-27 NOTE — Progress Notes (Signed)
Established Patient Office Visit  Subjective:  Patient ID: Kyle Jarvis, male    DOB: [birth year]  Age: 66 y.o. MRN: [id number]  CC:  Chief Complaint  Patient presents with   Annual Exam    Annual exam pt bp has been fluctuating high then normal    HPI Kyle Jarvis is a 66 y.o. male with past medical history of HTN and HLD who presents for annual physical.  HTN: His BP was elevated initially, but was better later during the visit.  Of note, he has not had his oral olmesartan this morning.  He denies any headache, dizziness, chest pain, dyspnea or palpitations.  HLD: He takes Crestor regularly.  Lumbar spinal stenosis: His back pain has improved now since the lumbar spinal surgery.  He denies any numbness, tingling or weakness of the LE.  He has completed PT.   Past Medical History:  Diagnosis Date   Allergy    COVID-19 04/26/2021   Farmer's lung (HCC)    GERD (gastroesophageal reflux disease)    Hypertension     Past Surgical History:  Procedure Laterality Date   CHOLECYSTECTOMY     FRACTURE SURGERY Right    hand surgery- in [place] Bilateral    inguinal   SPLENECTOMY     TONSILLECTOMY     out at age 81   TRANSFORAMINAL LUMBAR INTERBODY FUSION W/ MIS 2 LEVEL N/A 02/25/2022   Procedure: LATERAL LUMBAR INTERBODY FUSION, LEFT  LUMBAR THREE-FOUR, LUMBAR FOUR-FIVE, PRONE TRANSPSOAS, PERCUTANEOUS PEDICLE SCREW FIXATION;  Surgeon: Vallarie Mare, MD;  Location: [facility];  Service: Neurosurgery;  Laterality: N/A;    History reviewed. No pertinent family history.  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, forensic, Air cabin crew  Tobacco Use   Smoking status: Former    Packs/day: 1.00    Years: 9.00    Total pack  years: 9.00    Types: Cigarettes    Quit date: 2000    Years since quitting: 23.7   Smokeless tobacco: Former    Types: Chew    Quit date: 05/15/2016  Vaping Use   Vaping Use: Never used  Substance and Sexual Activity   Alcohol use: Yes    Alcohol/week: 28.0 standard drinks of alcohol    Types: 14 Cans of beer, 14 Shots of liquor per week    Comment: trying to cut back   Drug use: Not Currently   Sexual activity: Not Currently    Comment: erectile dysfunction- since hernia surgery  Other Topics Concern   Not on file  Social History Narrative   Moved to [place] 2 years ago to be closer to daughter.   Social Determinants of Health   Financial Resource Strain: Low Risk  (06/24/2021)   Overall Financial Resource Strain (CARDIA)    Difficulty of Paying Living Expenses: Not hard at all  Food Insecurity: No Food Insecurity (06/24/2021)   Hunger Vital Sign    Worried About Running Out of Food in the Last Year: Never true    Ran Out of Food in the Last Year: Never true  Transportation Needs: No Transportation Needs (06/24/2021)   PRAPARE - Hydrologist (Medical): No  Lack of Transportation (Non-Medical): No  Physical Activity: Sufficiently Active (06/24/2021)   Exercise Vital Sign    Days of Exercise per Week: 5 days    Minutes of Exercise per Session: 60 min  Stress: No Stress Concern Present (06/24/2021)   [place]    Feeling of Stress : Not at all  Social Connections: [place] (06/24/2021)   Social Connection and Isolation Panel [NHANES]    Frequency of Communication with Friends and Family: More than three times a week    Frequency of Social Gatherings with Friends and Family: More than three times a week    Attends Religious Services: More than 4 times per year    Active Member of [employer] or Organizations: Yes    Attends Music therapist: More than 4  times per year    Marital Status: Married  Human resources officer Violence: Not At Risk (06/24/2021)   Humiliation, Afraid, Rape, and Kick questionnaire    Fear of Current or Ex-Partner: No    Emotionally Abused: No    Physically Abused: No    Sexually Abused: No    Outpatient Medications Prior to Visit  Medication Sig Dispense Refill   albuterol (VENTOLIN HFA) 108 (90 Base) MCG/ACT inhaler Inhale 2 puffs into the lungs every 4 (four) hours as needed for wheezing or shortness of breath. 6.7 g 3   cholecalciferol (VITAMIN D3) 25 MCG (1000 UNIT) tablet Take 1,000 Units by mouth daily.     COLLAGEN PO Take 2 capsules by mouth daily.     EPINEPHrine 0.3 mg/0.3 mL IJ SOAJ injection Inject 0.3 mg into the muscle as needed for anaphylaxis. 1 each 1   Multiple Minerals (CALCIUM-MAGNESIUM-ZINC) TABS Take 1 tablet by mouth daily.     olmesartan (BENICAR) 20 MG tablet Take 1 tablet (20 mg total) by mouth daily. 90 tablet 3   omeprazole (PRILOSEC OTC) 20 MG tablet Take 20 mg by mouth daily.     ORAL ELECTROLYTES PO Take 1 capsule by mouth daily.     rosuvastatin (CRESTOR) 40 MG tablet Take 1 tablet (40 mg total) by mouth daily. 90 tablet 3   sildenafil (VIAGRA) 100 MG tablet Take 1 tablet (100 mg total) by mouth daily as needed for erectile dysfunction. 30 tablet prn   methocarbamol (ROBAXIN-750) 750 MG tablet Take 1 tablet (750 mg total) by mouth every 6 (six) hours as needed for muscle spasms. 90 tablet 3   oxyCODONE-acetaminophen (PERCOCET) 5-325 MG tablet Take 1-2 tablets by mouth every 4 (four) hours as needed for severe pain. 40 tablet 0   No facility-administered medications prior to visit.    Allergies  Allergen Reactions   Bee Venom Anaphylaxis   Hydrocortisone Swelling    Had back injection, and he had to be wheelchair bound for 3 months   Pollen Extract     Hay Fever    ROS Review of Systems  Constitutional:  Negative for chills and fever.  HENT:  Negative for congestion and sore  throat.   Eyes:  Negative for pain and discharge.  Respiratory:  Negative for cough and shortness of breath.   Cardiovascular:  Negative for chest pain and palpitations.  Gastrointestinal:  Negative for diarrhea, nausea and vomiting.  Endocrine: Negative for polydipsia and polyuria.  Genitourinary:  Negative for dysuria and hematuria.  Musculoskeletal:  Negative for neck pain and neck stiffness.  Skin:  Negative for rash.  Neurological:  Negative for dizziness, weakness,  numbness and headaches.  Psychiatric/Behavioral:  Negative for agitation and behavioral problems.       Objective:    Physical Exam Vitals reviewed.  Constitutional:      General: He is not in acute distress.    Appearance: He is not diaphoretic.  HENT:     Head: Normocephalic and atraumatic.     Nose: Nose normal.     Mouth/Throat:     Mouth: Mucous membranes are moist.  Eyes:     General: No scleral icterus.    Extraocular Movements: Extraocular movements intact.  Cardiovascular:     Rate and Rhythm: Normal rate and regular rhythm.     Pulses: Normal pulses.     Heart sounds: Normal heart sounds. No murmur heard. Pulmonary:     Breath sounds: Normal breath sounds. No wheezing or rales.  Abdominal:     Palpations: Abdomen is soft.     Tenderness: There is no abdominal tenderness.  Musculoskeletal:     Cervical back: Neck supple. No tenderness.     Right lower leg: No edema.     Left lower leg: No edema.  Skin:    General: Skin is warm.     Findings: No rash.  Neurological:     General: No focal deficit present.     Mental Status: He is alert and oriented to person, place, and time.     Cranial Nerves: No cranial nerve deficit.     Sensory: No sensory deficit.     Motor: No weakness.  Psychiatric:        Mood and Affect: Mood normal.        Behavior: Behavior normal.     BP 138/84 (BP Location: Left Arm, Cuff Size: Normal)   Pulse 82   Resp 18   Ht _0  (1.727 m)   Wt 189 lb 6.4 oz (85.9  kg)   SpO2 98%   BMI 28.80 kg/m  Wt Readings from Last 3 Encounters:  05/27/22 189 lb 6.4 oz (85.9 kg)  02/25/22 180 lb (81.6 kg)  02/18/22 181 lb 8 oz (82.3 kg)    No results found for: "TSH" Lab Results  Component Value Date   WBC 9.1 02/18/2022   HGB 15.0 02/18/2022   HCT 43.8 02/18/2022   MCV 91.1 02/18/2022   PLT 246 02/18/2022   Lab Results  Component Value Date   NA 138 02/18/2022   K 4.9 02/18/2022   CO2 23 02/18/2022   GLUCOSE 101 (H) 02/18/2022   BUN 30 (H) 02/18/2022   CREATININE 1.54 (H) 02/18/2022   BILITOT 0.4 11/15/2021   ALKPHOS 61 11/15/2021   AST 35 11/15/2021   ALT 35 11/15/2021   PROT 7.9 11/15/2021   ALBUMIN 4.8 11/15/2021   CALCIUM 10.3 02/18/2022   ANIONGAP 10 02/18/2022   EGFR 57 (L) 11/15/2021   Lab Results  Component Value Date   CHOL 230 (H) 11/15/2021   Lab Results  Component Value Date   HDL 75 11/15/2021   Lab Results  Component Value Date   LDLCALC 142 (H) 11/15/2021   Lab Results  Component Value Date   TRIG 76 11/15/2021   No results found for: "CHOLHDL" No results found for: "HGBA1C"    Assessment & Plan:   Problem List Items Addressed This Visit       Cardiovascular and Mediastinum   Essential hypertension    BP Readings from Last 1 Encounters:  05/27/22 138/84  Well-controlled with Olmesartan Counseled for compliance  with the medications Advised DASH diet and moderate exercise/walking, at least 150 mins/week      Relevant Orders   TSH   CMP14+EGFR   CBC with Differential/Platelet     Other   Mixed hyperlipidemia    Continue Crestor Check lipid profile      Relevant Orders   Lipid Profile   Refused influenza vaccine   Encounter for general adult medical examination with abnormal findings - Primary    Physical exam as documented. Fasting blood tests ordered. Denies flu vaccine. Advised to get Shingrix and Tdap vaccine at local pharmacy.      Relevant Orders   TSH   CMP14+EGFR   CBC with  Differential/Platelet   Lipid Profile   Prostate cancer screening    Ordered PSA after discussing its limitations for prostate cancer screening, including false positive results leading additional investigations.      Relevant Orders   PSA   Other Visit Diagnoses     Screening for diabetes mellitus       Relevant Orders   Hemoglobin A1c       No orders of the defined types were placed in this encounter.   Follow-up: Return in about 6 months (around 11/25/2022) for HTN and HLD.    Lindell Spar, MD

## 2022-05-28 LAB — CBC WITH DIFFERENTIAL/PLATELET
Basophils Absolute: 0.1 10*3/uL (ref 0.0–0.2)
Basos: 1 %
EOS (ABSOLUTE): 0.6 10*3/uL — ABNORMAL HIGH (ref 0.0–0.4)
Eos: 7 %
Hematocrit: 43.1 % (ref 37.5–51.0)
Hemoglobin: 14.5 g/dL (ref 13.0–17.7)
Immature Grans (Abs): 0 10*3/uL (ref 0.0–0.1)
Immature Granulocytes: 0 %
Lymphocytes Absolute: 2.9 10*3/uL (ref 0.7–3.1)
Lymphs: 30 %
MCH: 30.3 pg (ref 26.6–33.0)
MCHC: 33.6 g/dL (ref 31.5–35.7)
MCV: 90 fL (ref 79–97)
Monocytes Absolute: 1 10*3/uL — ABNORMAL HIGH (ref 0.1–0.9)
Monocytes: 11 %
Neutrophils Absolute: 4.8 10*3/uL (ref 1.4–7.0)
Neutrophils: 51 %
Platelets: 303 10*3/uL (ref 150–450)
RBC: 4.78 x10E6/uL (ref 4.14–5.80)
RDW: 13.4 % (ref 11.6–15.4)
WBC: 9.4 10*3/uL (ref 3.4–10.8)

## 2022-05-28 LAB — LIPID PANEL
Chol/HDL Ratio: 2.7 ratio (ref 0.0–5.0)
Cholesterol, Total: 219 mg/dL — ABNORMAL HIGH (ref 100–199)
HDL: 80 mg/dL (ref >39)
LDL Chol Calc (NIH): 119 mg/dL — ABNORMAL HIGH (ref 0–99)
Triglycerides: 117 mg/dL (ref 0–149)
VLDL Cholesterol Cal: 20 mg/dL (ref 5–40)

## 2022-05-28 LAB — CMP14+EGFR
ALT: 32 IU/L (ref 0–44)
AST: 34 IU/L (ref 0–40)
Albumin/Globulin Ratio: 1.5 (ref 1.2–2.2)
Albumin: 4.8 g/dL (ref 3.9–4.9)
Alkaline Phosphatase: 64 IU/L (ref 44–121)
BUN/Creatinine Ratio: 18 (ref 10–24)
BUN: 23 mg/dL (ref 8–27)
Bilirubin Total: 0.3 mg/dL (ref 0.0–1.2)
CO2: 20 mmol/L (ref 20–29)
Calcium: 10.1 mg/dL (ref 8.6–10.2)
Chloride: 100 mmol/L (ref 96–106)
Creatinine, Ser: 1.26 mg/dL (ref 0.76–1.27)
Globulin, Total: 3.3 g/dL (ref 1.5–4.5)
Glucose: 88 mg/dL (ref 70–99)
Potassium: 5.4 mmol/L — ABNORMAL HIGH (ref 3.5–5.2)
Sodium: 139 mmol/L (ref 134–144)
Total Protein: 8.1 g/dL (ref 6.0–8.5)
eGFR: 63 mL/min/{1.73_m2} (ref >59)

## 2022-05-28 LAB — HEMOGLOBIN A1C
Est. average glucose Bld gHb Est-mCnc: 117 mg/dL
Hgb A1c MFr Bld: 5.7 % — ABNORMAL HIGH (ref 4.8–5.6)

## 2022-05-28 LAB — TSH: TSH: 2.92 u[IU]/mL (ref 0.450–4.500)

## 2022-05-28 LAB — PSA: Prostate Specific Ag, Serum: 2.2 ng/mL (ref 0.0–4.0)

## 2022-08-11 ENCOUNTER — Ambulatory Visit (INDEPENDENT_AMBULATORY_CARE_PROVIDER_SITE_OTHER): Payer: Medicare HMO

## 2022-08-11 DIAGNOSIS — Z0000 Encounter for general adult medical examination without abnormal findings: Secondary | ICD-10-CM

## 2022-08-11 NOTE — Progress Notes (Signed)
Subjective:   Kyle Jarvis is a 66 y.o. male who presents for Medicare Annual/Subsequent preventive examination.  Review of Systems    I connected with  Kyle Jarvis on 08/11/22 by a audio enabled telemedicine application and verified that I am speaking with the correct person using two identifiers.  Patient Location: Home  Provider Location: Office/Clinic  I discussed the limitations of evaluation and management by telemedicine. The patient expressed understanding and agreed to proceed.        Objective:    There were no vitals filed for this visit. There is no height or weight on file to calculate BMI.     02/18/2022    9:07 AM 06/24/2021    3:26 PM  Advanced Directives  Does Patient Have a Medical Advance Directive? No No  Would patient like information on creating a medical advance directive? No - Patient declined No - Patient declined    Current Medications (verified) Outpatient Encounter Medications as of 08/11/2022  Medication Sig   albuterol (VENTOLIN HFA) 108 (90 Base) MCG/ACT inhaler Inhale 2 puffs into the lungs every 4 (four) hours as needed for wheezing or shortness of breath.   cholecalciferol (VITAMIN D3) 25 MCG (1000 UNIT) tablet Take 1,000 Units by mouth daily.   COLLAGEN PO Take 2 capsules by mouth daily.   EPINEPHrine 0.3 mg/0.3 mL IJ SOAJ injection Inject 0.3 mg into the muscle as needed for anaphylaxis.   Multiple Minerals (CALCIUM-MAGNESIUM-ZINC) TABS Take 1 tablet by mouth daily.   olmesartan (BENICAR) 20 MG tablet Take 1 tablet (20 mg total) by mouth daily.   omeprazole (PRILOSEC OTC) 20 MG tablet Take 20 mg by mouth daily.   ORAL ELECTROLYTES PO Take 1 capsule by mouth daily.   rosuvastatin (CRESTOR) 40 MG tablet Take 1 tablet (40 mg total) by mouth daily.   sildenafil (VIAGRA) 100 MG tablet Take 1 tablet (100 mg total) by mouth daily as needed for erectile dysfunction.   No facility-administered encounter medications on file as of  08/11/2022.    Allergies (verified) Bee venom, Hydrocortisone, and Pollen extract   History: Past Medical History:  Diagnosis Date   Allergy    COVID-19 04/26/2021   Farmer's lung (HCC)    GERD (gastroesophageal reflux disease)    Hypertension    Past Surgical History:  Procedure Laterality Date   CHOLECYSTECTOMY     FRACTURE SURGERY Right    hand surgery- in [place]   HERNIA REPAIR Bilateral    inguinal   SPLENECTOMY     TONSILLECTOMY     out at age 28   TRANSFORAMINAL LUMBAR INTERBODY FUSION W/ MIS 2 LEVEL N/A 02/25/2022   Procedure: LATERAL LUMBAR INTERBODY FUSION, LEFT  LUMBAR THREE-FOUR, LUMBAR FOUR-FIVE, PRONE TRANSPSOAS, PERCUTANEOUS PEDICLE SCREW FIXATION;  Surgeon: Bedelia Person, MD;  Location: [facility];  Service: Neurosurgery;  Laterality: N/A;   No family history on file. 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, maintenance, Education officer, museum  Tobacco Use   Smoking status: Former    Packs/day: 1.00    Years: 9.00    Total pack years: 9.00    Types: Cigarettes    Quit date: 2000    Years since quitting: 23.9  Smokeless tobacco: Former    Types: Chew    Quit date: 05/15/2016  Vaping Use   Vaping Use: Never used  Substance and Sexual Activity   Alcohol use: Yes    Alcohol/week: 28.0 standard drinks of alcohol    Types: 14 Cans of beer, 14 Shots of liquor per week    Comment: trying to cut back   Drug use: Not Currently   Sexual activity: Not Currently    Comment: erectile dysfunction- since hernia surgery  Other Topics Concern   Not on file  Social History Narrative   Moved to [place] 2 years ago to be closer to daughter.   Social Determinants of Health   Financial Resource Strain: Low Risk  (06/24/2021)   Overall Financial  Resource Strain (CARDIA)    Difficulty of Paying Living Expenses: Not hard at all  Food Insecurity: No Food Insecurity (06/24/2021)   Hunger Vital Sign    Worried About Running Out of Food in the Last Year: Never true    Ran Out of Food in the Last Year: Never true  Transportation Needs: No Transportation Needs (06/24/2021)   PRAPARE - Administrator, Civil Service (Medical): No    Lack of Transportation (Non-Medical): No  Physical Activity: Sufficiently Active (06/24/2021)   Exercise Vital Sign    Days of Exercise per Week: 5 days    Minutes of Exercise per Session: 60 min  Stress: No Stress Concern Present (06/24/2021)   [employer] of Occupational Health - Occupational Stress Questionnaire    Feeling of Stress : Not at all  Social Connections: Socially [place] (06/24/2021)   Social Connection and Isolation Panel [NHANES]    Frequency of Communication with Friends and Family: More than three times a week    Frequency of Social Gatherings with Friends and Family: More than three times a week    Attends Religious Services: More than 4 times per year    Active Member of [employer] or Organizations: Yes    Attends Banker Meetings: More than 4 times per year    Marital Status: Married    Tobacco Counseling Counseling given: Not Answered   Clinical Intake:  Kyle Jarvis , Thank you for taking time to come for your Medicare Wellness Visit. I appreciate your ongoing commitment to your health goals. Please review the following plan we discussed and let me know if I can assist you in the future.   These are the goals we discussed:  Goals       Patient Stated (pt-stated)      Lose weight.        This is a list of the screening recommended for you and due dates:  Health Maintenance  Topic Date Due   COVID-19 Vaccine (1) Never done   DTaP/Tdap/Td vaccine (1 - Tdap) Never done   Zoster (Shingles) Vaccine (1 of 2) Never done   Flu Shot  11/30/2022*    Medicare Annual Wellness Visit  08/12/2023   Colon Cancer Screening  05/01/2027   Pneumonia Vaccine  Completed   Hepatitis C Screening: USPSTF Recommendation to screen - Ages 18-79 yo.  Completed   HPV Vaccine  Aged Out  *Topic was postponed. The date shown is not the original due date.     How often do you need to have someone help you when you read instructions, pamphlets, or other written materials from your doctor or pharmacy?: (P) 1 - Never  Diabetic?no  Activities of Daily Living  08/05/2022   10:13 AM 02/18/2022    9:10 AM  In your present state of health, do you have any difficulty performing the following activities:  Hearing? 0   Vision? 0   Difficulty concentrating or making decisions? 0   Walking or climbing stairs? 0   Dressing or bathing? 0   Doing errands, shopping? 0 0  Preparing Food and eating ? N   Using the Toilet? N   In the past six months, have you accidently leaked urine? N   Do you have problems with loss of bowel control? N   Managing your Medications? N   Managing your Finances? N   Housekeeping or managing your Housekeeping? N     Patient Care Team: Anabel HalonPatel, Rutwik K, MD as PCP - General (Internal Medicine)  Indicate any recent Medical Services you may have received from other than [facility] providers in the past year (date may be approximate).     Assessment:   This is a routine wellness examination for Kyle Jarvis.  Hearing/Vision screen No results found.  Dietary issues and exercise activities discussed:     Goals Addressed   None   Depression Screen    05/27/2022    9:07 AM 02/05/2022    1:29 PM 11/19/2021    3:00 PM 08/19/2021    3:31 PM 07/08/2021    3:37 PM 06/24/2021    3:08 PM 06/19/2021   10:54 AM  PHQ 2/9 Scores  PHQ - 2 Score 0 0 0 0 0 0 0    Fall Risk    08/05/2022   10:13 AM 05/27/2022    9:07 AM 02/05/2022    1:29 PM 11/19/2021    3:00 PM 08/19/2021    3:31 PM  Fall Risk   Falls in the past year? 0 0 0 1 0  Number  falls in past yr: 0 0 0 1 0  Injury with Fall? 0 0 0 0 0  Risk for fall due to :  No Fall Risks No Fall Risks History of fall(s);Impaired balance/gait No Fall Risks  Follow up  Falls evaluation completed Falls evaluation completed Falls evaluation completed;Falls prevention discussed Falls evaluation completed    FALL RISK PREVENTION PERTAINING TO THE HOME:  Any stairs in or around the home? No  If so, are there any without handrails?  N/a Home free of loose throw rugs in walkways, pet beds, electrical cords, etc? Yes  Adequate lighting in your home to reduce risk of falls? Yes   ASSISTIVE DEVICES UTILIZED TO PREVENT FALLS:  Life alert? No  Use of a cane, walker or w/c? No  Grab bars in the bathroom? Yes  Shower chair or bench in shower? No  Elevated toilet seat or a handicapped toilet? No         06/24/2021    4:37 PM 06/24/2021    3:36 PM  6CIT Screen  What Year? 0 points 0 points  What month? 0 points 0 points  What time? 0 points 0 points  Count back from 20 0 points 0 points  Months in reverse 2 points 2 points  Repeat phrase 0 points 0 points  Total Score 2 points 2 points    Immunizations Immunization History  Administered Date(s) Administered   PNEUMOCOCCAL CONJUGATE-20 05/15/2021    TDAP status: Due, Education has been provided regarding the importance of this vaccine. Advised may receive this vaccine at local pharmacy or Health Dept. Aware to provide a copy of the vaccination record if  obtained from local pharmacy or Health Dept. Verbalized acceptance and understanding.  Flu Vaccine status: Due, Education has been provided regarding the importance of this vaccine. Advised may receive this vaccine at local pharmacy or Health Dept. Aware to provide a copy of the vaccination record if obtained from local pharmacy or Health Dept. Verbalized acceptance and understanding.  Pneumococcal vaccine status: Up to date  Covid-19 vaccine status: Declined, Education has  been provided regarding the importance of this vaccine but patient still declined. Advised may receive this vaccine at local pharmacy or Health Dept.or vaccine clinic. Aware to provide a copy of the vaccination record if obtained from local pharmacy or Health Dept. Verbalized acceptance and understanding.  Qualifies for Shingles Vaccine? Yes   Zostavax completed No   Shingrix Completed?: No.    Education has been provided regarding the importance of this vaccine. Patient has been advised to call insurance company to determine out of pocket expense if they have not yet received this vaccine. Advised may also receive vaccine at local pharmacy or Health Dept. Verbalized acceptance and understanding.  Screening Tests Health Maintenance  Topic Date Due   COVID-19 Vaccine (1) Never done   DTaP/Tdap/Td (1 - Tdap) Never done   Zoster Vaccines- Shingrix (1 of 2) Never done   INFLUENZA VACCINE  11/30/2022 (Originally 04/01/2022)   Medicare Annual Wellness (AWV)  08/12/2023   COLONOSCOPY (Pts 45-30yrs Insurance coverage will need to be confirmed)  05/01/2027   Pneumonia Vaccine 67+ Years old  Completed   Hepatitis C Screening  Completed   HPV VACCINES  Aged Out    Health Maintenance  Health Maintenance Due  Topic Date Due   COVID-19 Vaccine (1) Never done   DTaP/Tdap/Td (1 - Tdap) Never done   Zoster Vaccines- Shingrix (1 of 2) Never done    Colorectal cancer screening: Type of screening: Colonoscopy. Completed 04/30/17. Repeat every 10 years  Lung Cancer Screening: (Low Dose CT Chest recommended if Age 57-80 years, 30 pack-year currently smoking OR have quit w/in 15years.) does not qualify.   Lung Cancer Screening Referral: no  Additional Screening:  Hepatitis C Screening: does qualify; Completed 08/06/21  Vision Screening: Recommended annual ophthalmology exams for early detection of glaucoma and other disorders of the eye. Is the patient up to date with their annual eye exam?  No  Who  is the provider or what is the name of the office in which the patient attends annual eye exams? N/a If pt is not established with a provider, would they like to be referred to a provider to establish care? No .   Dental Screening: Recommended annual dental exams for proper oral hygiene  Community Resource Referral / Chronic Care Management: CRR required this visit?  No   CCM required this visit?  No      Plan:     I have personally reviewed and noted the following in the patient's chart:   Medical and social history Use of alcohol, tobacco or illicit drugs  Current medications and supplements including opioid prescriptions. Patient is not currently taking opioid prescriptions. Functional ability and status Nutritional status Physical activity Advanced directives List of other physicians Hospitalizations, surgeries, and ER visits in previous 12 months Vitals Screenings to include cognitive, depression, and falls Referrals and appointments  In addition, I have reviewed and discussed with patient certain preventive protocols, quality metrics, and best practice recommendations. A written personalized care plan for preventive services as well as general preventive health recommendations were provided to  patient.     Jasper Riling, CMA   08/11/2022

## 2022-08-11 NOTE — Patient Instructions (Signed)
Kyle Jarvis , Thank you for taking time to come for your Medicare Wellness Visit. I appreciate your ongoing commitment to your health goals. Please review the following plan we discussed and let me know if I can assist you in the future.     Preventive Care 67 Years and Older, Male Preventive care refers to lifestyle choices and visits with your health care provider that can promote health and wellness. What does preventive care include? A yearly physical exam. This is also called an annual well check. Dental exams once or twice a year. Routine eye exams. Ask your health care provider how often you should have your eyes checked. Personal lifestyle choices, including: Daily care of your teeth and gums. Regular physical activity. Eating a healthy diet. Avoiding tobacco and drug use. Limiting alcohol use. Practicing safe sex. Taking low doses of aspirin every day. Taking vitamin and mineral supplements as recommended by your health care provider. What happens during an annual well check? The services and screenings done by your health care provider during your annual well check will depend on your age, overall health, lifestyle risk factors, and family history of disease. Counseling  Your health care provider may ask you questions about your: Alcohol use. Tobacco use. Drug use. Emotional well-being. Home and relationship well-being. Sexual activity. Eating habits. History of falls. Memory and ability to understand (cognition). Work and work Astronomer. Screening  You may have the following tests or measurements: Height, weight, and BMI. Blood pressure. Lipid and cholesterol levels. These may be checked every 5 years, or more frequently if you are over 85 years old. Skin check. Lung cancer screening. You may have this screening every year starting at age 70 if you have a 30-pack-year history of smoking and currently smoke or have quit within the past 15 years. Fecal occult  blood test (FOBT) of the stool. You may have this test every year starting at age 54. Flexible sigmoidoscopy or colonoscopy. You may have a sigmoidoscopy every 5 years or a colonoscopy every 10 years starting at age 40. Prostate cancer screening. Recommendations will vary depending on your family history and other risks. Hepatitis C blood test. Hepatitis B blood test. Sexually transmitted disease (STD) testing. Diabetes screening. This is done by checking your blood sugar (glucose) after you have not eaten for a while (fasting). You may have this done every 1-3 years. Abdominal aortic aneurysm (AAA) screening. You may need this if you are a current or former smoker. Osteoporosis. You may be screened starting at age 75 if you are at high risk. Talk with your health care provider about your test results, treatment options, and if necessary, the need for more tests. Vaccines  Your health care provider may recommend certain vaccines, such as: Influenza vaccine. This is recommended every year. Tetanus, diphtheria, and acellular pertussis (Tdap, Td) vaccine. You may need a Td booster every 10 years. Zoster vaccine. You may need this after age 25. Pneumococcal 13-valent conjugate (PCV13) vaccine. One dose is recommended after age 52. Pneumococcal polysaccharide (PPSV23) vaccine. One dose is recommended after age 65. Talk to your health care provider about which screenings and vaccines you need and how often you need them. This information is not intended to replace advice given to you by your health care provider. Make sure you discuss any questions you have with your health care provider. Document Released: 09/14/2015 Document Revised: 05/07/2016 Document Reviewed: 06/19/2015 Elsevier Interactive Patient Education  2017 [employer].  Fall Prevention in the [facility]  can cause injuries. They can happen to people of all ages. There are many things you can do to make your home safe and to help  prevent falls. What can I do on the outside of my home? Regularly fix the edges of walkways and driveways and fix any cracks. Remove anything that might make you trip as you walk through a door, such as a raised step or threshold. Trim any bushes or trees on the path to your home. Use bright outdoor lighting. Clear any walking paths of anything that might make someone trip, such as rocks or tools. Regularly check to see if handrails are loose or broken. Make sure that both sides of any steps have handrails. Any raised decks and porches should have guardrails on the edges. Have any leaves, snow, or ice cleared regularly. Use sand or salt on walking paths during winter. Clean up any spills in your garage right away. This includes oil or grease spills. What can I do in the bathroom? Use night lights. Install grab bars by the toilet and in the tub and shower. Do not use towel bars as grab bars. Use non-skid mats or decals in the tub or shower. If you need to sit down in the shower, use a plastic, non-slip stool. Keep the floor dry. Clean up any water that spills on the floor as soon as it happens. Remove soap buildup in the tub or shower regularly. Attach bath mats securely with double-sided non-slip rug tape. Do not have throw rugs and other things on the floor that can make you trip. What can I do in the bedroom? Use night lights. Make sure that you have a light by your bed that is easy to reach. Do not use any sheets or blankets that are too big for your bed. They should not hang down onto the floor. Have a firm chair that has side arms. You can use this for support while you get dressed. Do not have throw rugs and other things on the floor that can make you trip. What can I do in the kitchen? Clean up any spills right away. Avoid walking on wet floors. Keep items that you use a lot in easy-to-reach places. If you need to reach something above you, use a strong step stool that has a  grab bar. Keep electrical cords out of the way. Do not use floor polish or wax that makes floors slippery. If you must use wax, use non-skid floor wax. Do not have throw rugs and other things on the floor that can make you trip. What can I do with my stairs? Do not leave any items on the stairs. Make sure that there are handrails on both sides of the stairs and use them. Fix handrails that are broken or loose. Make sure that handrails are as long as the stairways. Check any carpeting to make sure that it is firmly attached to the stairs. Fix any carpet that is loose or worn. Avoid having throw rugs at the top or bottom of the stairs. If you do have throw rugs, attach them to the floor with carpet tape. Make sure that you have a light switch at the top of the stairs and the bottom of the stairs. If you do not have them, ask someone to add them for you. What else can I do to help prevent falls? Wear shoes that: Do not have high heels. Have rubber bottoms. Are comfortable and fit you well. Are closed at the  toe. Do not wear sandals. If you use a stepladder: Make sure that it is fully opened. Do not climb a closed stepladder. Make sure that both sides of the stepladder are locked into place. Ask someone to hold it for you, if possible. Clearly mark and make sure that you can see: Any grab bars or handrails. First and last steps. Where the edge of each step is. Use tools that help you move around (mobility aids) if they are needed. These include: Canes. Walkers. Scooters. Crutches. Turn on the lights when you go into a dark area. Replace any light bulbs as soon as they burn out. Set up your furniture so you have a clear path. Avoid moving your furniture around. If any of your floors are uneven, fix them. If there are any pets around you, be aware of where they are. Review your medicines with your doctor. Some medicines can make you feel dizzy. This can increase your chance of  falling. Ask your doctor what other things that you can do to help prevent falls. This information is not intended to replace advice given to you by your health care provider. Make sure you discuss any questions you have with your health care provider. Document Released: 06/14/2009 Document Revised: 01/24/2016 Document Reviewed: 09/22/2014 Elsevier Interactive Patient Education  2017 [employer].

## 2022-10-01 ENCOUNTER — Encounter: Payer: Self-pay | Admitting: Internal Medicine

## 2022-10-01 ENCOUNTER — Ambulatory Visit (INDEPENDENT_AMBULATORY_CARE_PROVIDER_SITE_OTHER): Payer: Medicare HMO | Admitting: Internal Medicine

## 2022-10-01 VITALS — BP 131/89 | HR 110 | Ht 68.0 in | Wt 191.2 lb

## 2022-10-01 DIAGNOSIS — K219 Gastro-esophageal reflux disease without esophagitis: Secondary | ICD-10-CM

## 2022-10-01 DIAGNOSIS — M67432 Ganglion, left wrist: Secondary | ICD-10-CM | POA: Diagnosis not present

## 2022-10-01 MED ORDER — OMEPRAZOLE 40 MG PO CPDR: 40.0000 mg | DELAYED_RELEASE_CAPSULE | Freq: Every day | ORAL | 5 refills | Status: DC

## 2022-10-01 NOTE — Patient Instructions (Signed)
Please start taking Omeprazole as prescribed.  Please use Voltaren gel over wrist area.  You are being referred to Hand surgeon for cyst over wrist area.

## 2022-10-03 DIAGNOSIS — K219 Gastro-esophageal reflux disease without esophagitis: Secondary | ICD-10-CM | POA: Insufficient documentation

## 2022-10-03 DIAGNOSIS — R140 Abdominal distension (gaseous): Secondary | ICD-10-CM | POA: Insufficient documentation

## 2022-10-03 DIAGNOSIS — M67432 Ganglion, left wrist: Secondary | ICD-10-CM | POA: Insufficient documentation

## 2022-10-03 NOTE — Progress Notes (Signed)
Acute Office Visit  Subjective:    Patient ID: Kyle Jarvis, male    DOB: 10-23-1955, 67 y.o.   MRN: [id number]  Chief Complaint  Patient presents with   [facility]    Patient is bloated and has gas. Also has growth on left wrist    HPI Patient is in today for complaint of epigastric discomfort and bloating.  His symptoms are worse after eating.  He denies any diarrhea, melena or hematochezia.  Denies any nausea or vomiting.  He has not tried any medicine for it yet.  He has also noticed a bump on his left wrist, which has been growing and causing radiating pain in his left arm.  Denies any recent injury.  He has mild tingling in the thumb and index finger.  Past Medical History:  Diagnosis Date   Allergy    COVID-19 04/26/2021   Farmer's lung (HCC)    GERD (gastroesophageal reflux disease)    Hypertension     Past Surgical History:  Procedure Laterality Date   CHOLECYSTECTOMY     FRACTURE SURGERY Right    hand surgery- in [place] Bilateral    inguinal   SPLENECTOMY     TONSILLECTOMY     out at age 62   TRANSFORAMINAL LUMBAR INTERBODY FUSION W/ MIS 2 LEVEL N/A 02/25/2022   Procedure: LATERAL LUMBAR INTERBODY FUSION, LEFT  LUMBAR THREE-FOUR, LUMBAR FOUR-FIVE, PRONE TRANSPSOAS, PERCUTANEOUS PEDICLE SCREW FIXATION;  Surgeon: Vallarie Mare, MD;  Location: [place];  Service: Neurosurgery;  Laterality: N/A;    History reviewed. No pertinent family history.  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, forensic, Air cabin crew  Tobacco Use   Smoking status: Former    Packs/day: 1.00    Years: 9.00    Total pack years: 9.00    Types: Cigarettes    Quit date: 2000    Years since quitting: 24.1   Smokeless tobacco: Former     Types: Chew    Quit date: 05/15/2016  Vaping Use   Vaping Use: Never used  Substance and Sexual Activity   Alcohol use: Yes    Alcohol/week: 28.0 standard drinks of alcohol    Types: 14 Cans of beer, 14 Shots of liquor per week    Comment: trying to cut back   Drug use: Not Currently   Sexual activity: Not Currently    Comment: erectile dysfunction- since hernia surgery  Other Topics Concern   Not on file  Social History Narrative   Moved to [place] 2 years ago to be closer to daughter.   Social Determinants of Health   Financial Resource Strain: Low Risk  (08/11/2022)   Overall Financial Resource Strain (CARDIA)    Difficulty of Paying Living Expenses: Not hard at all  Food Insecurity: No Food Insecurity (08/11/2022)   Hunger Vital Sign    Worried About Running Out of Food in the Last Year: Never true    Ran Out of Food in the Last Year: Never true  Transportation Needs: No Transportation Needs (08/11/2022)   PRAPARE - Hydrologist (Medical): No    Lack of Transportation (Non-Medical): No  Physical Activity: Insufficiently Active (08/11/2022)  Exercise Vital Sign    Days of Exercise per Week: 3 days    Minutes of Exercise per Session: 30 min  Stress: No Stress Concern Present (08/11/2022)   [place]    Feeling of Stress : Not at all  Social Connections: Moderately Isolated (08/11/2022)   Social Connection and Isolation Panel [NHANES]    Frequency of Communication with Friends and Family: Twice a week    Frequency of Social Gatherings with Friends and Family: More than three times a week    Attends Religious Services: Never    Marine scientist or Organizations: No    Attends Archivist Meetings: Never    Marital Status: Married  Human resources officer Violence: Not At Risk (08/11/2022)   Humiliation, Afraid, Rape, and Kick questionnaire    Fear of Current or  Ex-Partner: No    Emotionally Abused: No    Physically Abused: No    Sexually Abused: No    Outpatient Medications Prior to Visit  Medication Sig Dispense Refill   albuterol (VENTOLIN HFA) 108 (90 Base) MCG/ACT inhaler Inhale 2 puffs into the lungs every 4 (four) hours as needed for wheezing or shortness of breath. 6.7 g 3   cholecalciferol (VITAMIN D3) 25 MCG (1000 UNIT) tablet Take 1,000 Units by mouth daily.     COLLAGEN PO Take 2 capsules by mouth daily.     EPINEPHrine 0.3 mg/0.3 mL IJ SOAJ injection Inject 0.3 mg into the muscle as needed for anaphylaxis. 1 each 1   Multiple Minerals (CALCIUM-MAGNESIUM-ZINC) TABS Take 1 tablet by mouth daily.     olmesartan (BENICAR) 20 MG tablet Take 1 tablet (20 mg total) by mouth daily. 90 tablet 3   ORAL ELECTROLYTES PO Take 1 capsule by mouth daily.     rosuvastatin (CRESTOR) 40 MG tablet Take 1 tablet (40 mg total) by mouth daily. 90 tablet 3   sildenafil (VIAGRA) 100 MG tablet Take 1 tablet (100 mg total) by mouth daily as needed for erectile dysfunction. 30 tablet prn   omeprazole (PRILOSEC OTC) 20 MG tablet Take 20 mg by mouth daily.     No facility-administered medications prior to visit.    Allergies  Allergen Reactions   Bee Venom Anaphylaxis   Hydrocortisone Swelling    Had back injection, and he had to be wheelchair bound for 3 months   Pollen Extract     Hay Fever    Review of Systems  Constitutional:  Negative for chills and fever.  HENT:  Negative for congestion and sore throat.   Eyes:  Negative for pain and discharge.  Respiratory:  Negative for cough and shortness of breath.   Cardiovascular:  Negative for chest pain and palpitations.  Gastrointestinal:  Negative for diarrhea, nausea and vomiting.  Endocrine: Negative for polydipsia and polyuria.  Genitourinary:  Negative for dysuria and hematuria.  Musculoskeletal:  Negative for neck pain and neck stiffness.       Left wrist bump/cyst  Skin:  Negative for rash.   Neurological:  Negative for dizziness, weakness, numbness and headaches.  Psychiatric/Behavioral:  Negative for agitation and behavioral problems.        Objective:    Physical Exam Vitals reviewed.  Constitutional:      General: He is not in acute distress.    Appearance: He is not diaphoretic.  HENT:     Head: Normocephalic and atraumatic.     Nose: Nose normal.  Mouth/Throat:     Mouth: Mucous membranes are moist.  Eyes:     General: No scleral icterus.    Extraocular Movements: Extraocular movements intact.  Cardiovascular:     Rate and Rhythm: Normal rate and regular rhythm.     Pulses: Normal pulses.     Heart sounds: Normal heart sounds. No murmur heard. Pulmonary:     Breath sounds: Normal breath sounds. No wheezing or rales.  Abdominal:     Palpations: Abdomen is soft.     Tenderness: There is no abdominal tenderness.  Musculoskeletal:     Cervical back: Neck supple. No tenderness.     Right lower leg: No edema.     Left lower leg: No edema.  Skin:    General: Skin is warm.     Findings: No rash.     Comments: Left wrist cyst, about 1 cm in diameter on radial side  Neurological:     General: No focal deficit present.     Mental Status: He is alert and oriented to person, place, and time.     Cranial Nerves: No cranial nerve deficit.     Sensory: No sensory deficit.     Motor: No weakness.  Psychiatric:        Mood and Affect: Mood normal.        Behavior: Behavior normal.     BP 131/89 (BP Location: Left Arm, Patient Position: Sitting, Cuff Size: Normal)   Pulse (!) 110   Ht 5\' 8"  (1.727 m)   Wt 191 lb 3.2 oz (86.7 kg)   SpO2 96%   BMI 29.07 kg/m  Wt Readings from Last 3 Encounters:  10/01/22 191 lb 3.2 oz (86.7 kg)  05/27/22 189 lb 6.4 oz (85.9 kg)  02/25/22 180 lb (81.6 kg)        Assessment & Plan:   Problem List Items Addressed This Visit       Digestive   Gastroesophageal reflux disease - Primary    Epigastric discomfort and  bloating could be due to gastritis/GERD Trial of omeprazole Avoid hot and spicy food      Relevant Medications   omeprazole (PRILOSEC) 40 MG capsule     Other   Ganglion cyst of dorsum of left wrist    Left wrist bump likely ganglion cyst or bone cyst Since he has pain, referred to hand surgery      Relevant Orders   Ambulatory referral to Orthopedic Surgery     Meds ordered this encounter  Medications   DISCONTD: omeprazole (PRILOSEC) 40 MG capsule    Sig: Take 1 capsule (40 mg total) by mouth daily.    Dispense:  30 capsule    Refill:  5   omeprazole (PRILOSEC) 40 MG capsule    Sig: Take 1 capsule (40 mg total) by mouth daily.    Dispense:  30 capsule    Refill:  5      Keith Rake, MD

## 2022-10-03 NOTE — Assessment & Plan Note (Addendum)
Epigastric discomfort and bloating could be due to gastritis/GERD Trial of omeprazole Avoid hot and spicy food

## 2022-10-03 NOTE — Assessment & Plan Note (Signed)
Left wrist bump likely ganglion cyst or bone cyst Since he has pain, referred to hand surgery

## 2022-10-10 DIAGNOSIS — M1812 Unilateral primary osteoarthritis of first carpometacarpal joint, left hand: Secondary | ICD-10-CM | POA: Diagnosis not present

## 2022-10-10 DIAGNOSIS — M25532 Pain in left wrist: Secondary | ICD-10-CM | POA: Diagnosis not present

## 2022-10-30 ENCOUNTER — Other Ambulatory Visit: Payer: Self-pay | Admitting: Internal Medicine

## 2022-10-30 DIAGNOSIS — I10 Essential (primary) hypertension: Secondary | ICD-10-CM

## 2022-10-30 DIAGNOSIS — E7800 Pure hypercholesterolemia, unspecified: Secondary | ICD-10-CM

## 2022-11-26 ENCOUNTER — Encounter: Payer: Self-pay | Admitting: Internal Medicine

## 2022-11-26 ENCOUNTER — Ambulatory Visit: Payer: Medicare HMO | Admitting: Internal Medicine

## 2022-12-25 ENCOUNTER — Ambulatory Visit (INDEPENDENT_AMBULATORY_CARE_PROVIDER_SITE_OTHER): Payer: Medicare HMO | Admitting: Internal Medicine

## 2022-12-25 ENCOUNTER — Encounter: Payer: Self-pay | Admitting: Internal Medicine

## 2022-12-25 VITALS — BP 134/88 | HR 95 | Ht 68.0 in | Wt 188.8 lb

## 2022-12-25 DIAGNOSIS — M19042 Primary osteoarthritis, left hand: Secondary | ICD-10-CM

## 2022-12-25 DIAGNOSIS — Z125 Encounter for screening for malignant neoplasm of prostate: Secondary | ICD-10-CM

## 2022-12-25 DIAGNOSIS — R7303 Prediabetes: Secondary | ICD-10-CM

## 2022-12-25 DIAGNOSIS — E782 Mixed hyperlipidemia: Secondary | ICD-10-CM

## 2022-12-25 DIAGNOSIS — E559 Vitamin D deficiency, unspecified: Secondary | ICD-10-CM | POA: Diagnosis not present

## 2022-12-25 DIAGNOSIS — M48061 Spinal stenosis, lumbar region without neurogenic claudication: Secondary | ICD-10-CM | POA: Diagnosis not present

## 2022-12-25 DIAGNOSIS — I10 Essential (primary) hypertension: Secondary | ICD-10-CM

## 2022-12-25 NOTE — Assessment & Plan Note (Addendum)
BP Readings from Last 1 Encounters:  12/25/22 134/88   Well-controlled with Olmesartan Counseled for compliance with the medications Advised DASH diet and moderate exercise/walking, at least 150 mins/week

## 2022-12-25 NOTE — Assessment & Plan Note (Signed)
Ordered PSA after discussing its limitations for prostate cancer screening, including false positive results leading additional investigations. 

## 2022-12-25 NOTE — Assessment & Plan Note (Signed)
Continue Crestor Check lipid profile 

## 2022-12-25 NOTE — Assessment & Plan Note (Signed)
Lab Results  Component Value Date   HGBA1C 5.7 (H) 05/27/2022   Advised to follow DASH diet

## 2022-12-25 NOTE — Assessment & Plan Note (Signed)
Has been evaluated by hand surgery Has [facility] thumb brace - pain improved

## 2022-12-25 NOTE — Progress Notes (Signed)
Established Patient Office Visit  Subjective:  Patient ID: Kyle Jarvis, male    DOB: 04/20/1956  Age: 67 y.o. MRN: [id number]  CC:  Chief Complaint  Patient presents with   Hypertension    HPI Kyle Most Kyle Jarvis is a 67 y.o. male with past medical history of HTN and HLD who presents for f/u of his chronic medical conditions.  HTN: BP is well-controlled. Takes medications regularly. Patient denies headache, dizziness, chest pain, dyspnea or palpitations.  HLD: He takes Crestor now.  His lipid profile has improved now.  Lumbar spinal stenosis: His back pain has improved now since the lumbar spinal surgery.  He denies any numbness, tingling or weakness of the LE.  He has completed PT.   He was evaluated by Dr. Frazier Butt for left wrist and thumb pain.  He was given thumb brace, which has improved his pain.   Past Medical History:  Diagnosis Date   Allergy    COVID-19 04/26/2021   Farmer's lung    GERD (gastroesophageal reflux disease)    Hypertension     Past Surgical History:  Procedure Laterality Date   CHOLECYSTECTOMY     FRACTURE SURGERY Right    hand surgery- in [place]   HERNIA REPAIR Bilateral    inguinal   SPLENECTOMY     TONSILLECTOMY     out at age 40   TRANSFORAMINAL LUMBAR INTERBODY FUSION W/ MIS 2 LEVEL N/A 02/25/2022   Procedure: LATERAL LUMBAR INTERBODY FUSION, LEFT  LUMBAR THREE-FOUR, LUMBAR FOUR-FIVE, PRONE TRANSPSOAS, PERCUTANEOUS PEDICLE SCREW FIXATION;  Surgeon: Bedelia Person, MD;  Location: [facility] OR;  Service: Neurosurgery;  Laterality: N/A;    History reviewed. No pertinent family history.  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, maintenance, Education officer, museum  Tobacco Use   Smoking status: Former    Packs/day:  1.00    Years: 9.00    Additional pack years: 0.00    Total pack years: 9.00    Types: Cigarettes    Quit date: 2000    Years since quitting: 24.3   Smokeless tobacco: Former    Types: Chew    Quit date: 05/15/2016  Vaping Use   Vaping Use: Never used  Substance and Sexual Activity   Alcohol use: Yes    Alcohol/week: 28.0 standard drinks of alcohol    Types: 14 Cans of beer, 14 Shots of liquor per week    Comment: trying to cut back   Drug use: Not Currently   Sexual activity: Not Currently    Comment: erectile dysfunction- since hernia surgery  Other Topics Concern   Not on file  Social History Narrative   Moved to [place] 2 years ago to be closer to daughter.   Social Determinants of Health   Financial Resource Strain: Low Risk  (08/11/2022)   Overall Financial Resource Strain (CARDIA)    Difficulty of Paying Living Expenses: Not hard at all  Food Insecurity: No Food Insecurity (08/11/2022)   Hunger Vital Sign    Worried About Running Out of Food in the Last Year: Never true    Ran Out of Food in the Last Year: Never true  Transportation Needs: No Transportation Needs (08/11/2022)   PRAPARE - Transportation  Lack of Transportation (Medical): No    Lack of Transportation (Non-Medical): No  Physical Activity: Insufficiently Active (08/11/2022)   Exercise Vital Sign    Days of Exercise per Week: 3 days    Minutes of Exercise per Session: 30 min  Stress: No Stress Concern Present (08/11/2022)   [employer] of Occupational Health - Occupational Stress Questionnaire    Feeling of Stress : Not at all  Social Connections: Moderately Isolated (08/11/2022)   Social Connection and Isolation Panel [NHANES]    Frequency of Communication with Friends and Family: Twice a week    Frequency of Social Gatherings with Friends and Family: More than three times a week    Attends Religious Services: Never    Database administrator or Organizations: No    Attends Tax inspector Meetings: Never    Marital Status: Married  Catering manager Violence: Not At Risk (08/11/2022)   Humiliation, Afraid, Rape, and Kick questionnaire    Fear of Current or Ex-Partner: No    Emotionally Abused: No    Physically Abused: No    Sexually Abused: No    Outpatient Medications Prior to Visit  Medication Sig Dispense Refill   albuterol (VENTOLIN HFA) 108 (90 Base) MCG/ACT inhaler Inhale 2 puffs into the lungs every 4 (four) hours as needed for wheezing or shortness of breath. 6.7 g 3   cholecalciferol (VITAMIN D3) 25 MCG (1000 UNIT) tablet Take 1,000 Units by mouth daily.     COLLAGEN PO Take 2 capsules by mouth daily.     EPINEPHrine 0.3 mg/0.3 mL IJ SOAJ injection Inject 0.3 mg into the muscle as needed for anaphylaxis. 1 each 1   Multiple Minerals (CALCIUM-MAGNESIUM-ZINC) TABS Take 1 tablet by mouth daily.     olmesartan (BENICAR) 20 MG tablet TAKE 1 TABLET BY MOUTH EVERY DAY 90 tablet 3   omeprazole (PRILOSEC) 40 MG capsule Take 1 capsule (40 mg total) by mouth daily. 30 capsule 5   ORAL ELECTROLYTES PO Take 1 capsule by mouth daily.     rosuvastatin (CRESTOR) 40 MG tablet TAKE 1 TABLET BY MOUTH EVERY DAY 90 tablet 3   sildenafil (VIAGRA) 100 MG tablet Take 1 tablet (100 mg total) by mouth daily as needed for erectile dysfunction. 30 tablet prn   No facility-administered medications prior to visit.    Allergies  Allergen Reactions   Bee Venom Anaphylaxis   Hydrocortisone Swelling    Had back injection, and he had to be wheelchair bound for 3 months   Pollen Extract     Hay Fever    ROS Review of Systems  Constitutional:  Negative for chills and fever.  HENT:  Negative for congestion and sore throat.   Eyes:  Negative for pain and discharge.  Respiratory:  Negative for cough and shortness of breath.   Cardiovascular:  Negative for chest pain and palpitations.  Gastrointestinal:  Negative for diarrhea, nausea and vomiting.  Endocrine: Negative for  polydipsia and polyuria.  Genitourinary:  Negative for dysuria and hematuria.  Musculoskeletal:  Positive for arthralgias (Left wrist and hand) and back pain. Negative for neck pain and neck stiffness.  Skin:  Negative for rash.  Neurological:  Negative for dizziness, numbness and headaches.  Psychiatric/Behavioral:  Negative for agitation and behavioral problems.       Objective:    Physical Exam Vitals reviewed.  Constitutional:      General: He is not in acute distress.    Appearance: He is not diaphoretic.  HENT:     Head: Normocephalic and atraumatic.     Nose: Nose normal.     Mouth/Throat:     Mouth: Mucous membranes are moist.  Eyes:     General: No scleral icterus.    Extraocular Movements: Extraocular movements intact.  Cardiovascular:     Rate and Rhythm: Normal rate and regular rhythm.     Pulses: Normal pulses.     Heart sounds: Normal heart sounds. No murmur heard. Pulmonary:     Breath sounds: Normal breath sounds. No wheezing or rales.  Musculoskeletal:     Cervical back: Neck supple. No tenderness.     Right lower leg: No edema.     Left lower leg: No edema.     Comments: [facility] thumb brace in place on left hand  Skin:    General: Skin is warm.     Findings: No rash.  Neurological:     General: No focal deficit present.     Mental Status: He is alert and oriented to person, place, and time.     Sensory: No sensory deficit.     Motor: No weakness.  Psychiatric:        Mood and Affect: Mood normal.        Behavior: Behavior normal.     BP 134/88 (BP Location: Right Arm)   Pulse 95   Ht  (1.727 m)   Wt 188 lb 12.8 oz (85.6 kg)   SpO2 96%   BMI 28.71 kg/m  Wt Readings from Last 3 Encounters:  12/25/22 188 lb 12.8 oz (85.6 kg)  10/01/22 191 lb 3.2 oz (86.7 kg)  05/27/22 189 lb 6.4 oz (85.9 kg)    Lab Results  Component Value Date   TSH 2.920 05/27/2022   Lab Results  Component Value Date   WBC 9.4 05/27/2022   HGB 14.5 05/27/2022    HCT 43.1 05/27/2022   MCV 90 05/27/2022   PLT 303 05/27/2022   Lab Results  Component Value Date   NA 139 05/27/2022   K 5.4 (H) 05/27/2022   CO2 20 05/27/2022   GLUCOSE 88 05/27/2022   BUN 23 05/27/2022   CREATININE 1.26 05/27/2022   BILITOT 0.3 05/27/2022   ALKPHOS 64 05/27/2022   AST 34 05/27/2022   ALT 32 05/27/2022   PROT 8.1 05/27/2022   ALBUMIN 4.8 05/27/2022   CALCIUM 10.1 05/27/2022   ANIONGAP 10 02/18/2022   EGFR 63 05/27/2022   Lab Results  Component Value Date   CHOL 219 (H) 05/27/2022   Lab Results  Component Value Date   HDL 80 05/27/2022   Lab Results  Component Value Date   LDLCALC 119 (H) 05/27/2022   Lab Results  Component Value Date   TRIG 117 05/27/2022   Lab Results  Component Value Date   CHOLHDL 2.7 05/27/2022   Lab Results  Component Value Date   HGBA1C 5.7 (H) 05/27/2022      Assessment & Plan:   Problem List Items Addressed This Visit       Cardiovascular and Mediastinum   Essential hypertension - Primary    BP Readings from Last 1 Encounters:  12/25/22 134/88  Well-controlled with Olmesartan Counseled for compliance with the medications Advised DASH diet and moderate exercise/walking, at least 150 mins/week      Relevant Orders   TSH   CMP14+EGFR   CBC with Differential/Platelet     Musculoskeletal and Integument   Primary osteoarthritis of left hand  Has been evaluated by hand surgery Has [facility] thumb brace - pain improved        Other   Mixed hyperlipidemia    Continue Crestor Check lipid profile      Relevant Orders   Lipid panel   Lumbar spinal stenosis    Chronic low back pain improved S/p lumbar fusion surgery Followed by spine surgery Advised to avoid heavy lifting and frequent bending Tylenol as needed Heating pad and/or back brace as needed      Prostate cancer screening    Ordered PSA after discussing its limitations for prostate cancer screening, including false positive results leading  additional investigations.      Relevant Orders   PSA   Prediabetes    Lab Results  Component Value Date   HGBA1C 5.7 (H) 05/27/2022  Advised to follow DASH diet      Relevant Orders   Hemoglobin A1c   CMP14+EGFR   Other Visit Diagnoses     Vitamin D deficiency       Relevant Orders   VITAMIN D 25 Hydroxy (Vit-D Deficiency, Fractures)      No orders of the defined types were placed in this encounter.   Follow-up: Return in about 6 months (around 06/26/2023) for Annual physical.    Anabel Halon, MD

## 2022-12-25 NOTE — Patient Instructions (Addendum)
Please continue to take medications as prescribed.  Please continue to follow low salt diet and perform moderate exercise/walking at least 150 mins/week.  Please get fasting blood tests done before the next visit.  Please consider getting Shingrix and Tdap vaccine at local pharmacy. 

## 2022-12-25 NOTE — Assessment & Plan Note (Addendum)
Chronic low back pain improved S/p lumbar fusion surgery Followed by spine surgery Advised to avoid heavy lifting and frequent bending Tylenol as needed Heating pad and/or back brace as needed

## 2022-12-31 IMAGING — MR MR LUMBAR SPINE W/O CM
4 of 5 series · 30 of 48 positions shown · non-contrast
Comparison: None.

CLINICAL DATA: Lumbar radiculopathy, symptoms persist with > 6 wks
treatment

EXAM:
MRI LUMBAR SPINE WITHOUT CONTRAST
TECHNIQUE: Multiplanar, multisequence MR imaging of the lumbar spine was
performed. No intravenous contrast was administered.

[Series 5: T2 · sagittal · 4.0mm · 0.68mm/px · 6 of 17 slices shown (1 of 2)]
[im 1/17]
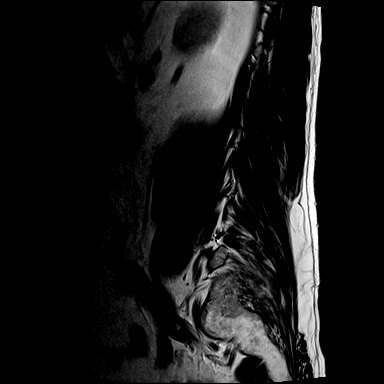
[im 4/17]
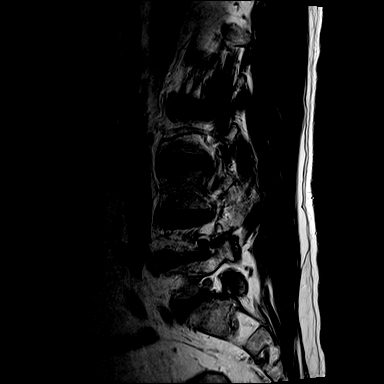
[im 7/17]
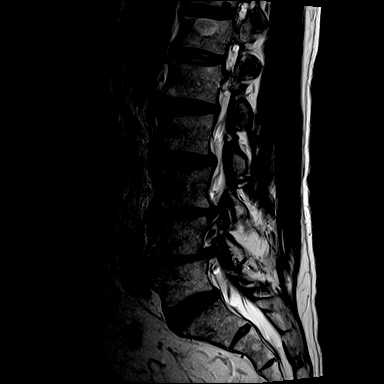
[im 10/17]
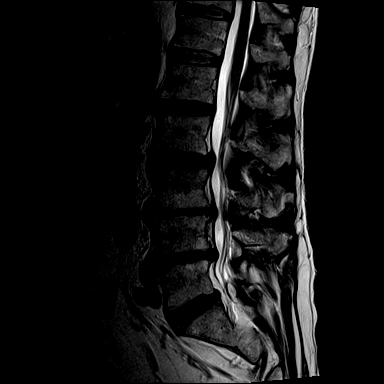
[im 13/17]
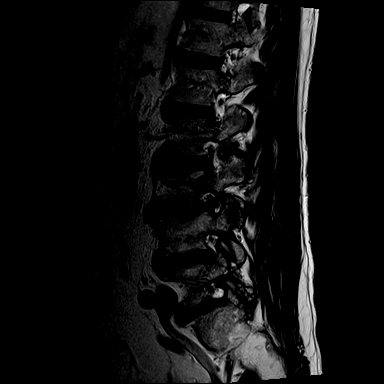
[im 17/17]
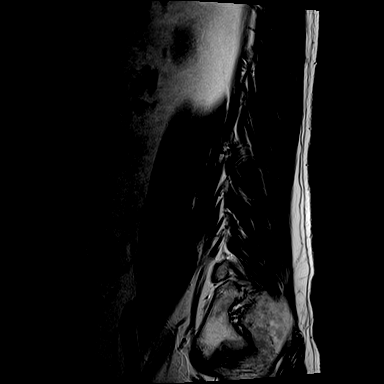

[Series 6: T1 · sagittal · 4.0mm · 0.81mm/px · 6 of 15 slices shown (1 of 2)]
[im 1/15]
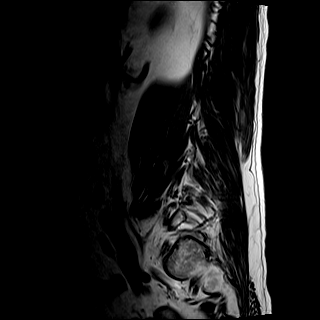
[im 3/15]
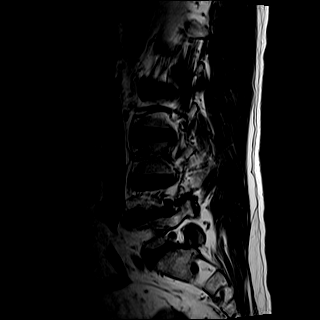
[im 6/15]
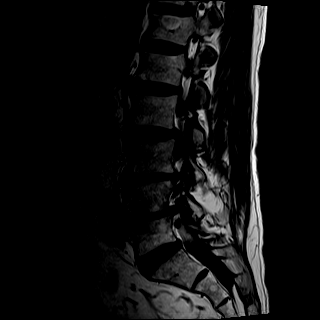
[im 9/15]
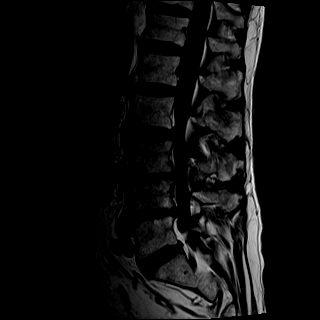
[im 12/15]
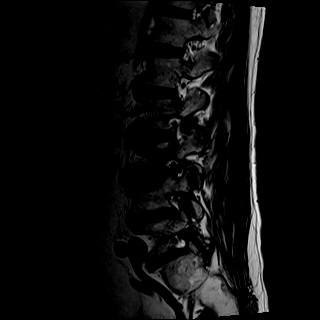
[im 15/15]
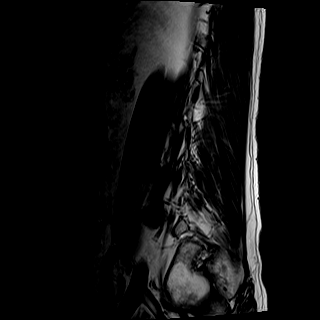

[Series 8: T2 · axial · 4.0mm · 0.70mm/px · z∈[-108,+162]mm · 9 of 37 slices shown (2 of 2)]
[im 1/37]
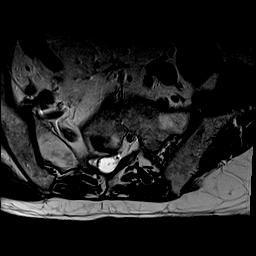
[im 6/37]
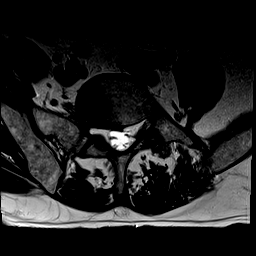
[im 11/37]
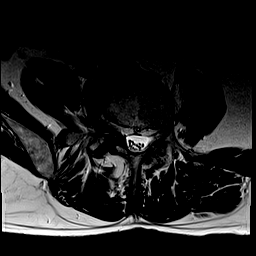
[im 16/37]
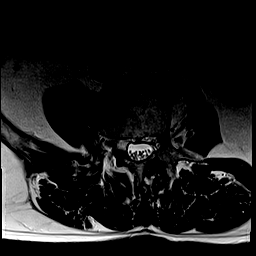
[im 19/37]
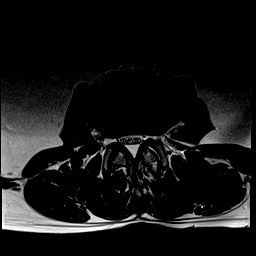
[im 21/37]
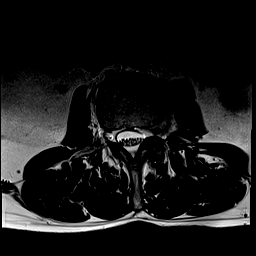
[im 26/37]
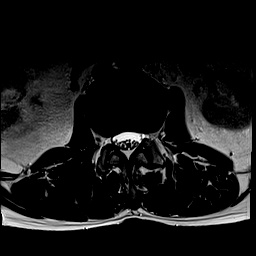
[im 31/37]
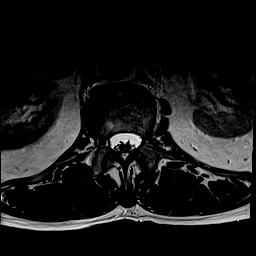
[im 37/37]
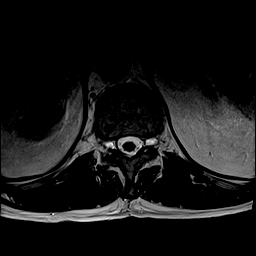

[Series 9: T1 · axial · 4.0mm · 0.35mm/px · z∈[-108,+162]mm · 9 of 37 slices shown (2 of 2)]
[im 1/37]
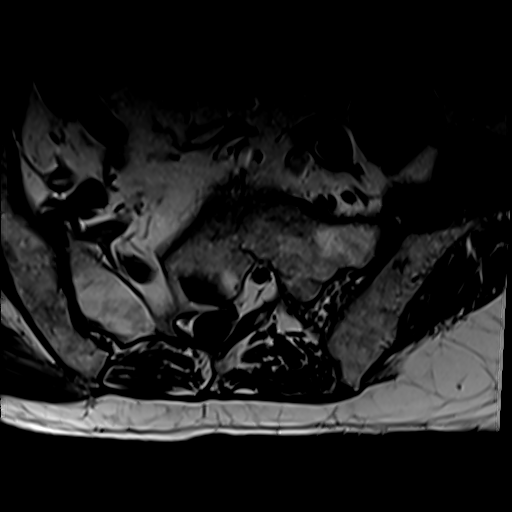
[im 6/37]
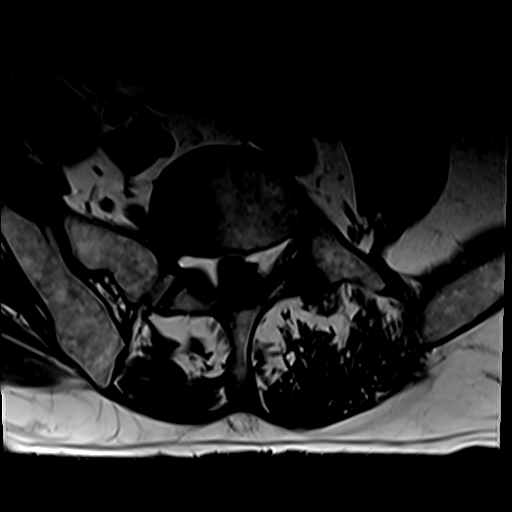
[im 11/37]
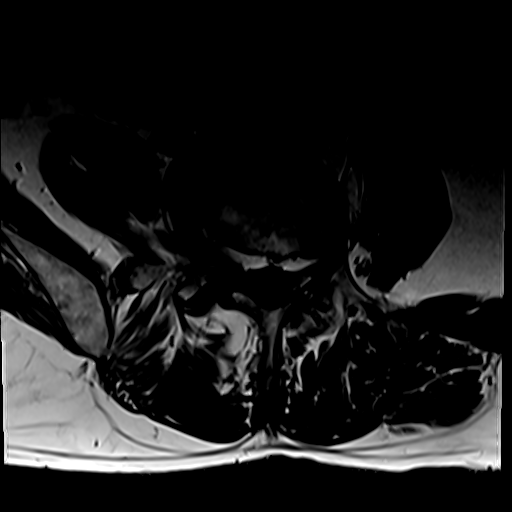
[im 16/37]
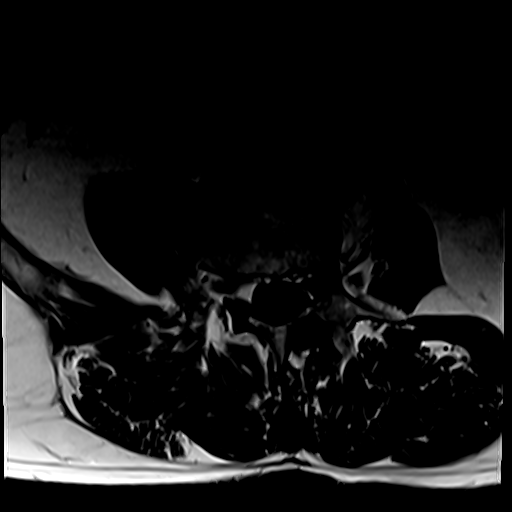
[im 19/37]
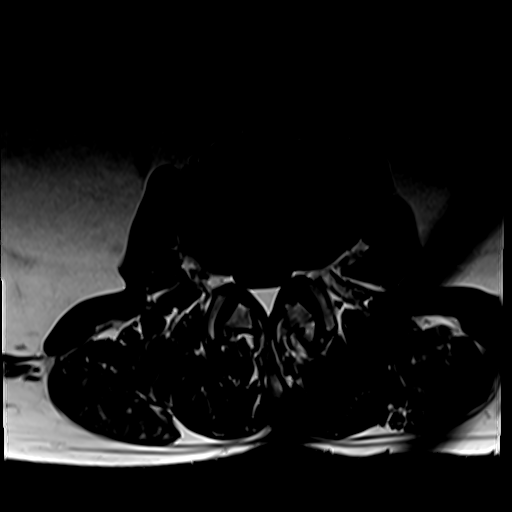
[im 21/37]
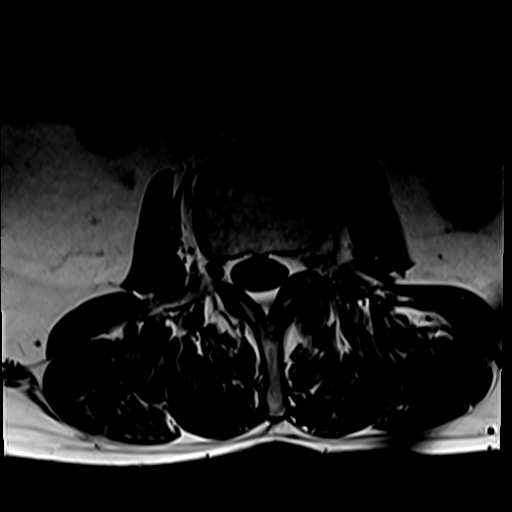
[im 26/37]
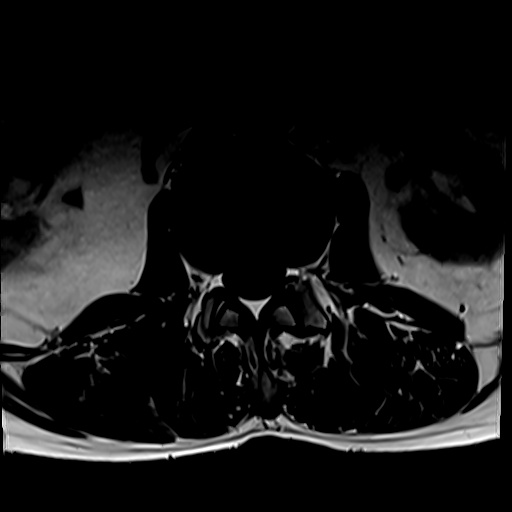
[im 31/37]
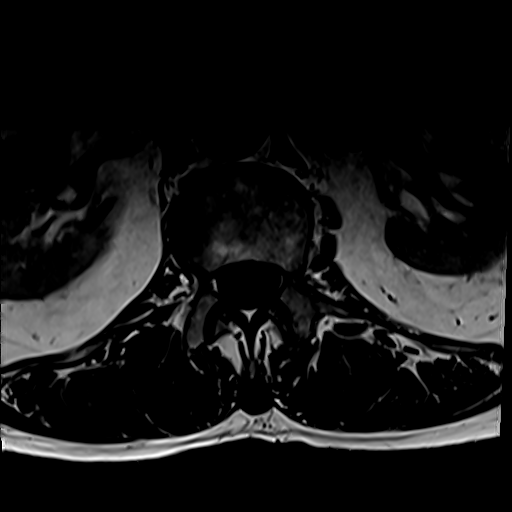
[im 37/37]
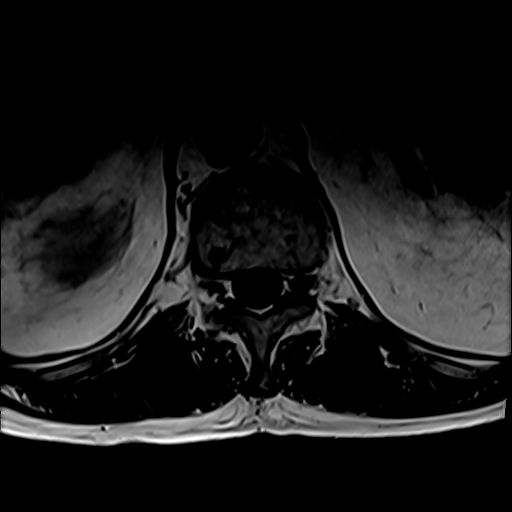

[30 of 48 positions shown; findings below may reference images not displayed]

FINDINGS: Segmentation:  Standard.

Alignment:  No significant anteroposterior listhesis.

Vertebrae: Degenerative endplate irregularity and mild degenerative
marrow edema at L3-L4 eccentric to the left. Incidental small
vertebral body hemangioma at T12. No suspicious osseous lesion.

Conus medullaris and cauda equina: Conus extends to the L1-L2 level.
Conus and cauda equina appear normal.

Paraspinal and other soft tissues: Unremarkable

Disc levels:

L1-L2: Minimal disc bulge. No significant canal or foraminal
stenosis.

L2-L3: Disc bulge with small superimposed left central/subarticular
protrusion. Prominent dorsal epidural fat. Mild canal stenosis. No
significant foraminal stenosis.

L3-L4: Disc bulge with superimposed central and left foraminal
protrusions. Endplate osteophytic ridging. Mild facet arthropathy.
Mild prominence of dorsal epidural fat. Moderate canal stenosis.
Partial effacement of the subarticular recesses. Mild right and
moderate to marked left foraminal stenosis with potential
compression of exiting left nerve roots.

L4-L5: Disc bulge with superimposed right foraminal protrusion.
Endplate osteophytic ridging. Mild facet arthropathy with ligamentum
flavum infolding. Mild canal stenosis. Partial effacement of the
right greater than left subarticular recesses. Moderate to marked
right foraminal stenosis with potential compression of exiting nerve
roots. Mild left foraminal stenosis.

L5-S1: Disc bulge. Mild right and marked left facet arthropathy with
ligamentum flavum infolding. There is a 4 mm synovial cyst
associated with the left facet joint along the left aspect of the
thecal sac. No canal or right foraminal stenosis. Minor left
foraminal stenosis.
IMPRESSION: Multilevel degenerative changes as detailed above. Canal stenosis is
greatest at L3-L4. Subarticular recess stenosis is present at L3-L4
and L4-L5. Foraminal stenosis is greatest on the right at L4-L5 and
on the left at L3-L4. Facet arthropathy greatest on the left at
L5-S1 with a small intraspinal synovial cyst.

## 2023-01-22 DIAGNOSIS — K5900 Constipation, unspecified: Secondary | ICD-10-CM | POA: Diagnosis not present

## 2023-01-22 DIAGNOSIS — I129 Hypertensive chronic kidney disease with stage 1 through stage 4 chronic kidney disease, or unspecified chronic kidney disease: Secondary | ICD-10-CM | POA: Diagnosis not present

## 2023-01-22 DIAGNOSIS — N529 Male erectile dysfunction, unspecified: Secondary | ICD-10-CM | POA: Diagnosis not present

## 2023-01-22 DIAGNOSIS — N182 Chronic kidney disease, stage 2 (mild): Secondary | ICD-10-CM | POA: Diagnosis not present

## 2023-01-22 DIAGNOSIS — E785 Hyperlipidemia, unspecified: Secondary | ICD-10-CM | POA: Diagnosis not present

## 2023-01-22 DIAGNOSIS — Z8249 Family history of ischemic heart disease and other diseases of the circulatory system: Secondary | ICD-10-CM | POA: Diagnosis not present

## 2023-01-22 DIAGNOSIS — M4800 Spinal stenosis, site unspecified: Secondary | ICD-10-CM | POA: Diagnosis not present

## 2023-01-22 DIAGNOSIS — Z008 Encounter for other general examination: Secondary | ICD-10-CM | POA: Diagnosis not present

## 2023-01-22 DIAGNOSIS — M1990 Unspecified osteoarthritis, unspecified site: Secondary | ICD-10-CM | POA: Diagnosis not present

## 2023-01-22 DIAGNOSIS — J302 Other seasonal allergic rhinitis: Secondary | ICD-10-CM | POA: Diagnosis not present

## 2023-01-22 DIAGNOSIS — J670 Farmer's lung: Secondary | ICD-10-CM | POA: Diagnosis not present

## 2023-01-22 DIAGNOSIS — K219 Gastro-esophageal reflux disease without esophagitis: Secondary | ICD-10-CM | POA: Diagnosis not present

## 2023-01-22 DIAGNOSIS — Z87891 Personal history of nicotine dependence: Secondary | ICD-10-CM | POA: Diagnosis not present

## 2023-03-26 ENCOUNTER — Other Ambulatory Visit: Payer: Self-pay | Admitting: Internal Medicine

## 2023-03-26 DIAGNOSIS — K219 Gastro-esophageal reflux disease without esophagitis: Secondary | ICD-10-CM

## 2023-06-19 DIAGNOSIS — I10 Essential (primary) hypertension: Secondary | ICD-10-CM | POA: Diagnosis not present

## 2023-06-19 DIAGNOSIS — E782 Mixed hyperlipidemia: Secondary | ICD-10-CM | POA: Diagnosis not present

## 2023-06-19 DIAGNOSIS — Z125 Encounter for screening for malignant neoplasm of prostate: Secondary | ICD-10-CM | POA: Diagnosis not present

## 2023-06-19 DIAGNOSIS — E559 Vitamin D deficiency, unspecified: Secondary | ICD-10-CM | POA: Diagnosis not present

## 2023-06-19 DIAGNOSIS — R7303 Prediabetes: Secondary | ICD-10-CM | POA: Diagnosis not present

## 2023-06-20 LAB — CMP14+EGFR
ALT: 28 [IU]/L (ref 0–44)
AST: 33 [IU]/L (ref 0–40)
Albumin: 4.5 g/dL (ref 3.9–4.9)
Alkaline Phosphatase: 67 [IU]/L (ref 44–121)
BUN/Creatinine Ratio: 14 (ref 10–24)
BUN: 22 mg/dL (ref 8–27)
Bilirubin Total: 0.3 mg/dL (ref 0.0–1.2)
CO2: 19 mmol/L — ABNORMAL LOW (ref 20–29)
Calcium: 9.8 mg/dL (ref 8.6–10.2)
Chloride: 102 mmol/L (ref 96–106)
Creatinine, Ser: 1.54 mg/dL — ABNORMAL HIGH (ref 0.76–1.27)
Globulin, Total: 3.1 g/dL (ref 1.5–4.5)
Glucose: 81 mg/dL (ref 70–99)
Potassium: 4.9 mmol/L (ref 3.5–5.2)
Sodium: 139 mmol/L (ref 134–144)
Total Protein: 7.6 g/dL (ref 6.0–8.5)
eGFR: 49 mL/min/{1.73_m2} — ABNORMAL LOW (ref >59)

## 2023-06-20 LAB — PSA: Prostate Specific Ag, Serum: 3.2 ng/mL (ref 0.0–4.0)

## 2023-06-20 LAB — LIPID PANEL
Chol/HDL Ratio: 2.5 {ratio} (ref 0.0–5.0)
Cholesterol, Total: 213 mg/dL — ABNORMAL HIGH (ref 100–199)
HDL: 86 mg/dL (ref >39)
LDL Chol Calc (NIH): 109 mg/dL — ABNORMAL HIGH (ref 0–99)
Triglycerides: 102 mg/dL (ref 0–149)
VLDL Cholesterol Cal: 18 mg/dL (ref 5–40)

## 2023-06-20 LAB — CBC WITH DIFFERENTIAL/PLATELET
Basophils Absolute: 0.1 10*3/uL (ref 0.0–0.2)
Basos: 1 %
EOS (ABSOLUTE): 0.3 10*3/uL (ref 0.0–0.4)
Eos: 4 %
Hematocrit: 43.3 % (ref 37.5–51.0)
Hemoglobin: 14.5 g/dL (ref 13.0–17.7)
Immature Grans (Abs): 0 10*3/uL (ref 0.0–0.1)
Immature Granulocytes: 0 %
Lymphocytes Absolute: 2.7 10*3/uL (ref 0.7–3.1)
Lymphs: 35 %
MCH: 31 pg (ref 26.6–33.0)
MCHC: 33.5 g/dL (ref 31.5–35.7)
MCV: 93 fL (ref 79–97)
Monocytes Absolute: 0.8 10*3/uL (ref 0.1–0.9)
Monocytes: 10 %
Neutrophils Absolute: 3.9 10*3/uL (ref 1.4–7.0)
Neutrophils: 50 %
Platelets: 278 10*3/uL (ref 150–450)
RBC: 4.67 x10E6/uL (ref 4.14–5.80)
RDW: 13.8 % (ref 11.6–15.4)
WBC: 7.7 10*3/uL (ref 3.4–10.8)

## 2023-06-20 LAB — HEMOGLOBIN A1C
Est. average glucose Bld gHb Est-mCnc: 134 mg/dL
Hgb A1c MFr Bld: 6.3 % — ABNORMAL HIGH (ref 4.8–5.6)

## 2023-06-20 LAB — VITAMIN D 25 HYDROXY (VIT D DEFICIENCY, FRACTURES): Vit D, 25-Hydroxy: 70.5 ng/mL (ref 30.0–100.0)

## 2023-06-20 LAB — TSH: TSH: 2.08 u[IU]/mL (ref 0.450–4.500)

## 2023-06-26 ENCOUNTER — Encounter: Payer: Self-pay | Admitting: Internal Medicine

## 2023-06-26 ENCOUNTER — Ambulatory Visit (INDEPENDENT_AMBULATORY_CARE_PROVIDER_SITE_OTHER): Payer: Medicare HMO | Admitting: Internal Medicine

## 2023-06-26 VITALS — BP 132/88 | HR 112 | Ht 68.0 in | Wt 183.2 lb

## 2023-06-26 DIAGNOSIS — R7303 Prediabetes: Secondary | ICD-10-CM

## 2023-06-26 DIAGNOSIS — R140 Abdominal distension (gaseous): Secondary | ICD-10-CM

## 2023-06-26 DIAGNOSIS — I10 Essential (primary) hypertension: Secondary | ICD-10-CM

## 2023-06-26 DIAGNOSIS — Z0001 Encounter for general adult medical examination with abnormal findings: Secondary | ICD-10-CM

## 2023-06-26 DIAGNOSIS — E782 Mixed hyperlipidemia: Secondary | ICD-10-CM

## 2023-06-26 DIAGNOSIS — R7989 Other specified abnormal findings of blood chemistry: Secondary | ICD-10-CM | POA: Insufficient documentation

## 2023-06-26 DIAGNOSIS — L600 Ingrowing nail: Secondary | ICD-10-CM | POA: Insufficient documentation

## 2023-06-26 MED ORDER — CLOBETASOL PROPIONATE 0.05 % EX CREA
1.0000 | TOPICAL_CREAM | Freq: Two times a day (BID) | CUTANEOUS | 0 refills | Status: DC
Start: 2023-06-26 — End: 2023-12-25

## 2023-06-26 NOTE — Patient Instructions (Addendum)
Soak the affected foot in warm, soapy water for 10 to 20 minutes twice daily, followed by the application of clobetasol cream for two weeks.  You are being referred to Podiatry.  Please maintain at least 64 ounces of fluid intake in a day.  Please continue to take medications as prescribed.  Please continue to follow low carb diet and perform moderate exercise/walking at least 150 mins/week.  Please get blood tests done after 2 weeks.

## 2023-06-26 NOTE — Assessment & Plan Note (Signed)
Advised to soak foot in warm water and then apply clobetasol cream Referred to podiatry

## 2023-06-26 NOTE — Assessment & Plan Note (Signed)
Physical exam as documented. Fasting blood tests ordered. Denies flu vaccine. Advised to get Shingrix and Tdap vaccine at local pharmacy.

## 2023-06-26 NOTE — Assessment & Plan Note (Addendum)
Epigastric discomfort and bloating could be due to gastritis/GERD, but did not get better with omeprazole -discontinue PPI Advised to try simethicone Keep food diary-try lactose-free dairy products Avoid hot and spicy food

## 2023-06-26 NOTE — Assessment & Plan Note (Signed)
BP Readings from Last 1 Encounters:  06/26/23 132/88   Well-controlled with Olmesartan Counseled for compliance with the medications Advised DASH diet and moderate exercise/walking, at least 150 mins/week

## 2023-06-26 NOTE — Assessment & Plan Note (Signed)
Likely due to poor p.o. intake related dehydration Needs to maintain at least 64 ounces of fluid intake in a day Recheck BMP in 2 weeks

## 2023-06-26 NOTE — Progress Notes (Signed)
Established Patient Office Visit  Subjective:  Patient ID: Kyle Jarvis, male    DOB: [birth year]  Age: 67 y.o. MRN: [id number]  CC:  Chief Complaint  Patient presents with   Annual Exam   Ingrown Toenail    Ingrown toenail that is swollen     HPI Kyle Jarvis is a 67 y.o. male with past medical history of HTN and HLD who presents for annual physical.  HTN: BP is well-controlled at home. Takes medications regularly. Patient denies headache, dizziness, chest pain, dyspnea or palpitations.  HLD: He takes Crestor now.  His lipid profile has improved now.   Lumbar spinal stenosis: His back pain has improved now since the lumbar spinal surgery.  He denies any numbness, tingling or weakness of the LE.  He has completed PT.   Ingrown toenail of right foot: He has history of ingrown toenail, but has been painful and swollen at times.  He attributes it to his inability to cut toenails properly due to problem with bending since back surgery.  He still reports bloating despite taking omeprazole.  He is going to try simethicone now.  He denies any nausea, melena or hematochezia.  Past Medical History:  Diagnosis Date   Allergy    COVID-19 04/26/2021   Farmer's lung (HCC)    GERD (gastroesophageal reflux disease)    Hypertension     Past Surgical History:  Procedure Laterality Date   CHOLECYSTECTOMY     FRACTURE SURGERY Right    hand surgery- in [place]   HERNIA REPAIR Bilateral    inguinal   SPLENECTOMY     TONSILLECTOMY     out at age 25   TRANSFORAMINAL LUMBAR INTERBODY FUSION W/ MIS 2 LEVEL N/A 02/25/2022   Procedure: LATERAL LUMBAR INTERBODY FUSION, LEFT  LUMBAR THREE-FOUR, LUMBAR FOUR-FIVE, PRONE TRANSPSOAS, PERCUTANEOUS PEDICLE SCREW FIXATION;  Surgeon: Bedelia Person, MD;  Location: [facility] OR;  Service: Neurosurgery;  Laterality: N/A;    History reviewed. No pertinent family history.  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, maintenance, Education officer, museum  Tobacco Use   Smoking status: Former    Current packs/day: 0.00    Average packs/day: 1 pack/day for 9.0 years (9.0 ttl pk-yrs)    Types: Cigarettes    Start date: 57    Quit date: 2000    Years since quitting: 24.8   Smokeless tobacco: Former    Types: Chew    Quit date: 05/15/2016  Vaping Use   Vaping status: Never Used  Substance and Sexual Activity   Alcohol use: Yes    Alcohol/week: 28.0 standard drinks of alcohol    Types: 14 Cans of beer, 14 Shots of liquor per week    Comment: trying to cut back   Drug use: Not Currently   Sexual activity: Not Currently    Comment: erectile dysfunction- since hernia surgery  Other Topics Concern   Not on file  Social History Narrative   Moved to [place] 2 years ago to be closer to daughter.   Social Determinants of Health   Financial Resource Strain: Low Risk  (08/11/2022)   Overall Financial Resource Strain (CARDIA)    Difficulty of Paying Living Expenses: Not hard at  all  Food Insecurity: No Food Insecurity (08/11/2022)   Hunger Vital Sign    Worried About Running Out of Food in the Last Year: Never true    Ran Out of Food in the Last Year: Never true  Transportation Needs: No Transportation Needs (08/11/2022)   PRAPARE - Administrator, Civil Service (Medical): No    Lack of Transportation (Non-Medical): No  Physical Activity: Insufficiently Active (08/11/2022)   Exercise Vital Sign    Days of Exercise per Week: 3 days    Minutes of Exercise per Session: 30 min  Stress: No Stress Concern Present (08/11/2022)   [employer] of Occupational Health - Occupational Stress Questionnaire    Feeling of Stress : Not at all  Social Connections: Moderately Isolated (08/11/2022)   Social  Connection and Isolation Panel [NHANES]    Frequency of Communication with Friends and Family: Twice a week    Frequency of Social Gatherings with Friends and Family: More than three times a week    Attends Religious Services: Never    Database administrator or Organizations: No    Attends Banker Meetings: Never    Marital Status: Married  Catering manager Violence: Not At Risk (08/11/2022)   Humiliation, Afraid, Rape, and Kick questionnaire    Fear of Current or Ex-Partner: No    Emotionally Abused: No    Physically Abused: No    Sexually Abused: No    Outpatient Medications Prior to Visit  Medication Sig Dispense Refill   albuterol (VENTOLIN HFA) 108 (90 Base) MCG/ACT inhaler Inhale 2 puffs into the lungs every 4 (four) hours as needed for wheezing or shortness of breath. 6.7 g 3   cholecalciferol (VITAMIN D3) 25 MCG (1000 UNIT) tablet Take 1,000 Units by mouth daily.     COLLAGEN PO Take 2 capsules by mouth daily.     EPINEPHrine 0.3 mg/0.3 mL IJ SOAJ injection Inject 0.3 mg into the muscle as needed for anaphylaxis. 1 each 1   Multiple Minerals (CALCIUM-MAGNESIUM-ZINC) TABS Take 1 tablet by mouth daily.     olmesartan (BENICAR) 20 MG tablet TAKE 1 TABLET BY MOUTH EVERY DAY 90 tablet 3   ORAL ELECTROLYTES PO Take 1 capsule by mouth daily.     rosuvastatin (CRESTOR) 40 MG tablet TAKE 1 TABLET BY MOUTH EVERY DAY 90 tablet 3   sildenafil (VIAGRA) 100 MG tablet Take 1 tablet (100 mg total) by mouth daily as needed for erectile dysfunction. 30 tablet prn   omeprazole (PRILOSEC) 40 MG capsule TAKE 1 CAPSULE (40 MG TOTAL) BY MOUTH DAILY. 90 capsule 1   No facility-administered medications prior to visit.    Allergies  Allergen Reactions   Bee Venom Anaphylaxis   Hydrocortisone Swelling    Had back injection, and he had to be wheelchair bound for 3 months   Pollen Extract     Hay Fever    ROS Review of Systems  Constitutional:  Negative for chills and fever.  HENT:   Negative for congestion and sore throat.   Eyes:  Negative for pain and discharge.  Respiratory:  Negative for cough and shortness of breath.   Cardiovascular:  Negative for chest pain and palpitations.  Gastrointestinal:  Negative for diarrhea, nausea and vomiting.  Endocrine: Negative for polydipsia and polyuria.  Genitourinary:  Negative for dysuria and hematuria.  Musculoskeletal:  Positive for arthralgias (Left wrist and hand) and back pain. Negative for neck pain and neck stiffness.  Skin:  Negative  for rash.  Neurological:  Negative for dizziness, numbness and headaches.  Psychiatric/Behavioral:  Negative for agitation and behavioral problems.       Objective:    Physical Exam Vitals reviewed.  Constitutional:      General: He is not in acute distress.    Appearance: He is not diaphoretic.  HENT:     Head: Normocephalic and atraumatic.     Nose: Nose normal.     Mouth/Throat:     Mouth: Mucous membranes are moist.  Eyes:     General: No scleral icterus.    Extraocular Movements: Extraocular movements intact.  Cardiovascular:     Rate and Rhythm: Normal rate and regular rhythm.     Pulses: Normal pulses.     Heart sounds: Normal heart sounds. No murmur heard. Pulmonary:     Breath sounds: Normal breath sounds. No wheezing or rales.  Abdominal:     Palpations: Abdomen is soft.     Tenderness: There is no abdominal tenderness.  Musculoskeletal:     Cervical back: Neck supple. No tenderness.     Right lower leg: No edema.     Left lower leg: No edema.     Comments: [facility] thumb brace in place on left hand  Feet:     Left foot:     Toenail Condition: Left toenails are ingrown.  Skin:    General: Skin is warm.     Findings: No rash.  Neurological:     General: No focal deficit present.     Mental Status: He is alert and oriented to person, place, and time.     Sensory: No sensory deficit.     Motor: No weakness.  Psychiatric:        Mood and Affect: Mood normal.         Behavior: Behavior normal.     BP 132/88 (BP Location: Left Arm)   Pulse (!) 112   Ht 5\' 8"  (1.727 m)   Wt 183 lb 3.2 oz (83.1 kg)   SpO2 95%   BMI 27.86 kg/m  Wt Readings from Last 3 Encounters:  06/26/23 183 lb 3.2 oz (83.1 kg)  12/25/22 188 lb 12.8 oz (85.6 kg)  10/01/22 191 lb 3.2 oz (86.7 kg)    Lab Results  Component Value Date   TSH 2.080 06/19/2023   Lab Results  Component Value Date   WBC 7.7 06/19/2023   HGB 14.5 06/19/2023   HCT 43.3 06/19/2023   MCV 93 06/19/2023   PLT 278 06/19/2023   Lab Results  Component Value Date   NA 139 06/19/2023   K 4.9 06/19/2023   CO2 19 (L) 06/19/2023   GLUCOSE 81 06/19/2023   BUN 22 06/19/2023   CREATININE 1.54 (H) 06/19/2023   BILITOT 0.3 06/19/2023   ALKPHOS 67 06/19/2023   AST 33 06/19/2023   ALT 28 06/19/2023   PROT 7.6 06/19/2023   ALBUMIN 4.5 06/19/2023   CALCIUM 9.8 06/19/2023   ANIONGAP 10 02/18/2022   EGFR 49 (L) 06/19/2023   Lab Results  Component Value Date   CHOL 213 (H) 06/19/2023   Lab Results  Component Value Date   HDL 86 06/19/2023   Lab Results  Component Value Date   LDLCALC 109 (H) 06/19/2023   Lab Results  Component Value Date   TRIG 102 06/19/2023   Lab Results  Component Value Date   CHOLHDL 2.5 06/19/2023   Lab Results  Component Value Date   HGBA1C 6.3 (H)  06/19/2023      Assessment & Plan:   Problem List Items Addressed This Visit       Cardiovascular and Mediastinum   Essential hypertension    BP Readings from Last 1 Encounters:  06/26/23 132/88   Well-controlled with Olmesartan Counseled for compliance with the medications Advised DASH diet and moderate exercise/walking, at least 150 mins/week      Relevant Orders   Basic Metabolic Panel (BMET)     Musculoskeletal and Integument   Ingrown toenail of right foot    Advised to soak foot in warm water and then apply clobetasol cream Referred to podiatry      Relevant Medications   clobetasol cream  (TEMOVATE) 0.05 %   Other Relevant Orders   Ambulatory referral to Podiatry     Other   Mixed hyperlipidemia    Continue Crestor Checked lipid profile      Encounter for general adult medical examination with abnormal findings - Primary    Physical exam as documented. Fasting blood tests ordered. Denies flu vaccine. Advised to get Shingrix and Tdap vaccine at local pharmacy.      Bloating    Epigastric discomfort and bloating could be due to gastritis/GERD, but did not get better with omeprazole -discontinue PPI Advised to try simethicone Keep food diary-try lactose-free dairy products Avoid hot and spicy food      Prediabetes    Lab Results  Component Value Date   HGBA1C 6.3 (H) 06/19/2023   Advised to follow DASH diet      Elevated serum creatinine    Likely due to poor p.o. intake related dehydration Needs to maintain at least 64 ounces of fluid intake in a day Recheck BMP in 2 weeks       Meds ordered this encounter  Medications   clobetasol cream (TEMOVATE) 0.05 %    Sig: Apply 1 Application topically 2 (two) times daily.    Dispense:  30 g    Refill:  0    Follow-up: Return in about 6 months (around 12/25/2023) for HTN.    Anabel Halon, MD

## 2023-06-26 NOTE — Assessment & Plan Note (Signed)
Lab Results  Component Value Date   HGBA1C 6.3 (H) 06/19/2023   Advised to follow DASH diet

## 2023-06-26 NOTE — Assessment & Plan Note (Addendum)
Continue Crestor Checked lipid profile 

## 2023-07-07 ENCOUNTER — Encounter: Payer: Self-pay | Admitting: Podiatry

## 2023-07-07 ENCOUNTER — Other Ambulatory Visit: Payer: Self-pay | Admitting: Podiatry

## 2023-07-07 ENCOUNTER — Ambulatory Visit: Payer: Medicare HMO | Admitting: Podiatry

## 2023-07-07 DIAGNOSIS — L600 Ingrowing nail: Secondary | ICD-10-CM | POA: Diagnosis not present

## 2023-07-07 NOTE — Progress Notes (Signed)
  Subjective:  Patient ID: Kyle Jarvis. [name], male    DOB: 1956/01/14,   MRN: [id number]  Chief Complaint  Patient presents with   Nail Problem    Pt presents for ingrown toenail great toe on the right.    67 y.o. male presents for concern of ingrown on right great toenail that has been present for months. Relates he has tried trimming and has been putting a cream on which helps. Relates this has been a chronic problem and would just like to get rid of all of the problem areas.  Denies any other pedal complaints. Denies n/v/f/c.   Past Medical History:  Diagnosis Date   Allergy    COVID-19 04/26/2021   Farmer's lung (HCC)    GERD (gastroesophageal reflux disease)    Hypertension     Objective:  Physical Exam: Vascular: DP/PT pulses 2/4 bilateral. CFT <3 seconds. Normal hair growth on digits. No edema.  Skin. No lacerations or abrasions bilateral feet. Incurvation of bilateral borders of bilateral great toes. Tender to medial border on right. No erythema edema or purulence noted.  Musculoskeletal: MMT 5/5 bilateral lower extremities in DF, PF, Inversion and Eversion. Deceased ROM in DF of ankle joint.  Neurological: Sensation intact to light touch.   Assessment:   1. Ingrown toenail of right foot   2. Ingrown left greater toenail      Plan:  Patient was evaluated and treated and all questions answered. Discussed ingrown toenails etiology and treatment options including procedure for removal vs conservative care.  Patient requesting removal of ingrown nail today. Procedure below.  Discussed procedure and post procedure care and patient expressed understanding.  Will follow-up in 2 weeks for nail check or sooner if any problems arise.    Procedure:  Procedure: partial Nail Avulsion of bilaterally hallux bilateral nail border.  Surgeon: Louann Sjogren, DPM  Pre-op Dx: Ingrown toenail without infection Post-op: Same  Place of Surgery: Office exam room.  Indications for  surgery: Painful and ingrown toenail.    The patient is requesting removal of nail with  chemical matrixectomy. Risks and complications were discussed with the patient for which they understand and written consent was obtained. Under sterile conditions a total of 3 mL of  1% lidocaine plain was infiltrated in a hallux block fashion. Once anesthetized, the skin was prepped in sterile fashion. A tourniquet was then applied. Next the bilateral aspect of hallux nail border was then sharply excised making sure to remove the entire offending nail border.  Next phenol was then applied under standard conditions to permanently destroy the matrix and copiously irrigated. Silvadene was applied. A dry sterile dressing was applied. After application of the dressing the tourniquet was removed and there is found to be an immediate capillary refill time to the digit. The patient tolerated the procedure well without any complications. Post procedure instructions were discussed the patient for which he verbally understood. Follow-up in two weeks for nail check or sooner if any problems are to arise. Discussed signs/symptoms of infection and directed to call the office immediately should any occur or go directly to the emergency room. In the meantime, encouraged to call the office with any questions, concerns, changes symptoms.    Louann Sjogren, DPM

## 2023-07-07 NOTE — Patient Instructions (Signed)

## 2023-07-13 DIAGNOSIS — I10 Essential (primary) hypertension: Secondary | ICD-10-CM | POA: Diagnosis not present

## 2023-07-14 LAB — BASIC METABOLIC PANEL
BUN/Creatinine Ratio: 11 (ref 10–24)
BUN: 19 mg/dL (ref 8–27)
CO2: 24 mmol/L (ref 20–29)
Calcium: 10.7 mg/dL — ABNORMAL HIGH (ref 8.6–10.2)
Chloride: 104 mmol/L (ref 96–106)
Creatinine, Ser: 1.74 mg/dL — ABNORMAL HIGH (ref 0.76–1.27)
Glucose: 78 mg/dL (ref 70–99)
Potassium: 5.3 mmol/L — ABNORMAL HIGH (ref 3.5–5.2)
Sodium: 143 mmol/L (ref 134–144)
eGFR: 42 mL/min/{1.73_m2} — ABNORMAL LOW (ref >59)

## 2023-07-20 ENCOUNTER — Other Ambulatory Visit: Payer: Self-pay | Admitting: Internal Medicine

## 2023-07-20 DIAGNOSIS — K219 Gastro-esophageal reflux disease without esophagitis: Secondary | ICD-10-CM

## 2023-07-27 ENCOUNTER — Ambulatory Visit: Payer: Medicare HMO | Admitting: Podiatry

## 2023-08-31 ENCOUNTER — Ambulatory Visit (INDEPENDENT_AMBULATORY_CARE_PROVIDER_SITE_OTHER): Payer: Medicare HMO

## 2023-08-31 VITALS — Ht 68.0 in | Wt 180.0 lb

## 2023-08-31 DIAGNOSIS — Z0000 Encounter for general adult medical examination without abnormal findings: Secondary | ICD-10-CM

## 2023-08-31 NOTE — Patient Instructions (Signed)
Kyle Jarvis , Thank you for taking time to come for your Medicare Wellness Visit. I appreciate your ongoing commitment to your health goals. Please review the following plan we discussed and let me know if I can assist you in the future.   Referrals/Orders/Follow-Ups/Clinician Recommendations:  August 31, 2024 at 10:40 am virtual visit  This is a list of the screening recommended for you and due dates:  Health Maintenance  Topic Date Due   Zoster (Shingles) Vaccine (2 of 2) 08/12/2022   COVID-19 Vaccine (1 - 2024-25 season) Never done   Flu Shot  11/30/2023*   Medicare Annual Wellness Visit  08/30/2024   Colon Cancer Screening  05/01/2027   DTaP/Tdap/Td vaccine (2 - Td or Tdap) 06/17/2032   Pneumonia Vaccine  Completed   Hepatitis C Screening  Completed   HPV Vaccine  Aged Out  *Topic was postponed. The date shown is not the original due date.    Advanced directives: (In Chart) A copy of your advanced directives are scanned into your chart should your provider ever need it.  Next Medicare Annual Wellness Visit scheduled for next year: Yes  Preventive Care 60 Years and Older, Male Preventive care refers to lifestyle choices and visits with your health care provider that can promote health and wellness. Preventive care visits are also called wellness exams. What can I expect for my preventive care visit? Counseling During your preventive care visit, your health care provider may ask about your: Medical history, including: Past medical problems. Family medical history. History of falls. Current health, including: Emotional well-being. Home life and relationship well-being. Sexual activity. Memory and ability to understand (cognition). Lifestyle, including: Alcohol, nicotine or tobacco, and drug use. Access to firearms. Diet, exercise, and sleep habits. Work and work Astronomer. Sunscreen use. Safety issues such as seatbelt and bike helmet use. Physical exam Your  health care provider will check your: Height and weight. These may be used to calculate your BMI (body mass index). BMI is a measurement that tells if you are at a healthy weight. Waist circumference. This measures the distance around your waistline. This measurement also tells if you are at a healthy weight and may help predict your risk of certain diseases, such as type 2 diabetes and high blood pressure. Heart rate and blood pressure. Body temperature. Skin for abnormal spots. What immunizations do I need?  Vaccines are usually given at various ages, according to a schedule. Your health care provider will recommend vaccines for you based on your age, medical history, and lifestyle or other factors, such as travel or where you work. What tests do I need? Screening Your health care provider may recommend screening tests for certain conditions. This may include: Lipid and cholesterol levels. Diabetes screening. This is done by checking your blood sugar (glucose) after you have not eaten for a while (fasting). Hepatitis C test. Hepatitis B test. HIV (human immunodeficiency virus) test. STI (sexually transmitted infection) testing, if you are at risk. Lung cancer screening. Colorectal cancer screening. Prostate cancer screening. Abdominal aortic aneurysm (AAA) screening. You may need this if you are a current or former smoker. Talk with your health care provider about your test results, treatment options, and if necessary, the need for more tests. Follow these instructions at home: Eating and drinking  Eat a diet that includes fresh fruits and vegetables, whole grains, lean protein, and low-fat dairy products. Limit your intake of foods with high amounts of sugar, saturated fats, and salt. Take vitamin and mineral  supplements as recommended by your health care provider. Do not drink alcohol if your health care provider tells you not to drink. If you drink alcohol: Limit how much you have  to 0-2 drinks a day. Know how much alcohol is in your drink. In the [place] one drink equals one 12 oz bottle of beer (355 mL), one 5 oz glass of wine (148 mL), or one 1 oz glass of hard liquor (44 mL). Lifestyle Brush your teeth every morning and night with fluoride toothpaste. Floss one time each day. Exercise for at least 30 minutes 5 or more days each week. Do not use any products that contain nicotine or tobacco. These products include cigarettes, chewing tobacco, and vaping devices, such as e-cigarettes. If you need help quitting, ask your health care provider. Do not use drugs. If you are sexually active, practice safe sex. Use a condom or other form of protection to prevent STIs. Take aspirin only as told by your health care provider. Make sure that you understand how much to take and what form to take. Work with your health care provider to find out whether it is safe and beneficial for you to take aspirin daily. Ask your health care provider if you need to take a cholesterol-lowering medicine (statin). Find healthy ways to manage stress, such as: Meditation, yoga, or listening to music. Journaling. Talking to a trusted person. Spending time with friends and family. Safety Always wear your seat belt while driving or riding in a vehicle. Do not drive: If you have been drinking alcohol. Do not ride with someone who has been drinking. When you are tired or distracted. While texting. If you have been using any mind-altering substances or drugs. Wear a helmet and other protective equipment during sports activities. If you have firearms in your house, make sure you follow all gun safety procedures. Minimize exposure to UV radiation to reduce your risk of skin cancer. What's next? Visit your health care provider once a year for an annual wellness visit. Ask your health care provider how often you should have your eyes and teeth checked. Stay up to date on all vaccines. This information  is not intended to replace advice given to you by your health care provider. Make sure you discuss any questions you have with your health care provider. Document Revised: 02/13/2021 Document Reviewed: 02/13/2021 Elsevier Patient Education  2024 [employer]. Understanding Your Risk for Falls Millions of people have serious injuries from falls each year. It is important to understand your risk of falling. Talk with your health care provider about your risk and what you can do to lower it. If you do have a serious fall, make sure to tell your provider. Falling once raises your risk of falling again. How can falls affect me? Serious injuries from falls are common. These include: Broken bones, such as hip fractures. Head injuries, such as traumatic brain injuries (TBI) or concussions. A fear of falling can cause you to avoid activities and stay at home. This can make your muscles weaker and raise your risk for a fall. What can increase my risk? There are a number of risk factors that increase your risk for falling. The more risk factors you have, the higher your risk of falling. Serious injuries from a fall happen most often to people who are older than 68 years old. Teenagers and young adults ages 48-29 are also at higher risk. Common risk factors include: Weakness in the lower body. Being generally weak or  confused due to long-term (chronic) illness. Dizziness or balance problems. Poor vision. Medicines that cause dizziness or drowsiness. These may include: Medicines for your blood pressure, heart, anxiety, insomnia, or swelling (edema). Pain medicines. Muscle relaxants. Other risk factors include: Drinking alcohol. Having had a fall in the past. Having foot pain or wearing improper footwear. Working at a dangerous job. Having any of the following in your home: Tripping hazards, such as floor clutter or loose rugs. Poor lighting. Pets. Having dementia or memory loss. What actions can  I take to lower my risk of falling?     Physical activity Stay physically fit. Do strength and balance exercises. Consider taking a regular class to build strength and balance. Yoga and tai chi are good options. Vision Have your eyes checked every year and your prescription for glasses or contacts updated as needed. Shoes and walking aids Wear non-skid shoes. Wear shoes that have rubber soles and low heels. Do not wear high heels. Do not walk around the house in socks or slippers. Use a cane or walker as told by your provider. Home safety Attach secure railings on both sides of your stairs. Install grab bars for your bathtub, shower, and toilet. Use a non-skid mat in your bathtub or shower. Attach bath mats securely with double-sided, non-slip rug tape. Use good lighting in all rooms. Keep a flashlight near your bed. Make sure there is a clear path from your bed to the bathroom. Use night-lights. Do not use throw rugs. Make sure all carpeting is taped or tacked down securely. Remove all clutter from walkways and stairways, including extension cords. Repair uneven or broken steps and floors. Avoid walking on icy or slippery surfaces. Walk on the grass instead of on icy or slick sidewalks. Use ice melter to get rid of ice on walkways in the winter. Use a cordless phone. Questions to ask your health care provider Can you help me check my risk for a fall? Do any of my medicines make me more likely to fall? Should I take a vitamin D supplement? What exercises can I do to improve my strength and balance? Should I make an appointment to have my vision checked? Do I need a bone density test to check for weak bones (osteoporosis)? Would it help to use a cane or a walker? Where to find more information Centers for Disease Control and Prevention, STEADI: TonerPromos.no Community-Based Fall Prevention Programs: TonerPromos.no [employer] on Aging: BaseRingTones.pl Contact a health care provider if: You  fall at home. You are afraid of falling at home. You feel weak, drowsy, or dizzy. This information is not intended to replace advice given to you by your health care provider. Make sure you discuss any questions you have with your health care provider. Document Revised: 04/21/2022 Document Reviewed: 04/21/2022 Elsevier Patient Education  2024 [employer].

## 2023-08-31 NOTE — Progress Notes (Signed)
 Because this visit was a virtual/telehealth visit,  certain criteria was not obtained, such a blood pressure, CBG if applicable, and timed get up and go. Any medications not marked as "taking" were not mentioned during the medication reconciliation part of the visit. Any vitals not documented were not able to be obtained due to this being a telehealth visit or patient was unable to self-report a recent blood pressure reading due to a lack of equipment at home via telehealth. Vitals that have been documented are verbally provided by the patient.   Subjective:   Kyle Jarvis is a 67 y.o. male who presents for Medicare Annual/Subsequent preventive examination.  Visit Complete: Virtual I connected with  Kyle Jarvis on 08/31/23 by a audio enabled telemedicine application and verified that I am speaking with the correct person using two identifiers.  Patient Location: Home  Provider Location: Home Office  I discussed the limitations of evaluation and management by telemedicine. The patient expressed understanding and agreed to proceed.  Vital Signs: Because this visit was a virtual/telehealth visit, some criteria may be missing or patient reported. Any vitals not documented were not able to be obtained and vitals that have been documented are patient reported.  Patient Medicare AWV questionnaire was completed by the patient on 08/29/2023; I have confirmed that all information answered by patient is correct and no changes since this date.  Cardiac Risk Factors include: advanced age (>36men, >52 women);hypertension;male gender     Objective:    Today's Vitals   08/31/23 1445  Weight: 180 lb (81.6 kg)  Height: 5\' 8"  (1.727 m)   Body mass index is 27.37 kg/m.     08/31/2023    2:47 PM 08/11/2022    9:37 AM 02/18/2022    9:07 AM 06/24/2021    3:26 PM  Advanced Directives  Does Patient Have a Medical Advance Directive? Yes Yes No No  Type of Engineer, mining of [place];Living will Living will;Healthcare Power of Attorney    Does patient want to make changes to medical advance directive? No - Patient declined No - Patient declined    Copy of Healthcare Power of Attorney in Chart? Yes - validated most recent copy scanned in chart (See row information) No - copy requested    Would patient like information on creating a medical advance directive?   No - Patient declined No - Patient declined    Current Medications (verified) Outpatient Encounter Medications as of 08/31/2023  Medication Sig   albuterol (VENTOLIN HFA) 108 (90 Base) MCG/ACT inhaler Inhale 2 puffs into the lungs every 4 (four) hours as needed for wheezing or shortness of breath.   cholecalciferol (VITAMIN D3) 25 MCG (1000 UNIT) tablet Take 1,000 Units by mouth daily.   COLLAGEN PO Take 2 capsules by mouth daily.   EPINEPHrine 0.3 mg/0.3 mL IJ SOAJ injection Inject 0.3 mg into the muscle as needed for anaphylaxis.   Multiple Minerals (CALCIUM-MAGNESIUM-ZINC) TABS Take 1 tablet by mouth daily.   olmesartan (BENICAR) 20 MG tablet TAKE 1 TABLET BY MOUTH EVERY DAY   ORAL ELECTROLYTES PO Take 1 capsule by mouth daily.   rosuvastatin (CRESTOR) 40 MG tablet TAKE 1 TABLET BY MOUTH EVERY DAY   sildenafil (VIAGRA) 100 MG tablet Take 1 tablet (100 mg total) by mouth daily as needed for erectile dysfunction.   clobetasol cream (TEMOVATE) 0.05 % Apply 1 Application topically 2 (two) times daily.   No facility-administered encounter medications on file as of 08/31/2023.  Allergies (verified) Bee venom, Hydrocortisone, and Pollen extract   History: Past Medical History:  Diagnosis Date   Allergy    COVID-19 04/26/2021   Farmer's lung (HCC)    GERD (gastroesophageal reflux disease)    Hypertension    Past Surgical History:  Procedure Laterality Date   CHOLECYSTECTOMY     FRACTURE SURGERY Right    hand surgery- in [place]   HERNIA REPAIR Bilateral    inguinal   SPLENECTOMY      TONSILLECTOMY     out at age 9   TRANSFORAMINAL LUMBAR INTERBODY FUSION W/ MIS 2 LEVEL N/A 02/25/2022   Procedure: LATERAL LUMBAR INTERBODY FUSION, LEFT  LUMBAR THREE-FOUR, LUMBAR FOUR-FIVE, PRONE TRANSPSOAS, PERCUTANEOUS PEDICLE SCREW FIXATION;  Surgeon: Bedelia Person, MD;  Location: [facility] OR;  Service: Neurosurgery;  Laterality: N/A;   History reviewed. No pertinent family history. 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, maintenance, Education officer, museum  Tobacco Use   Smoking status: Former    Current packs/day: 0.00    Average packs/day: 1 pack/day for 9.0 years (9.0 ttl pk-yrs)    Types: Cigarettes    Start date: 45    Quit date: 2000    Years since quitting: 25.0   Smokeless tobacco: Former    Types: Chew    Quit date: 05/15/2016  Vaping Use   Vaping status: Never Used  Substance and Sexual Activity   Alcohol use: Yes    Alcohol/week: 28.0 standard drinks of alcohol    Types: 14 Cans of beer, 14 Shots of liquor per week    Comment: trying to cut back   Drug use: Not Currently   Sexual activity: Not Currently    Comment: erectile dysfunction- since hernia surgery  Other Topics Concern   Not on file  Social History Narrative   Moved to [place] 2 years ago to be closer to daughter.   Social Drivers of Corporate investment banker Strain: Low Risk  (08/31/2023)   Overall Financial Resource Strain (CARDIA)    Difficulty of Paying Living Expenses: Not hard at all  Food Insecurity: No Food Insecurity (08/31/2023)   Hunger Vital Sign    Worried About Running Out of Food in the Last Year: Never true    Ran Out of Food in the Last Year: Never true  Transportation Needs: No Transportation Needs (08/31/2023)   PRAPARE - Scientist, research (physical sciences) (Medical): No    Lack of Transportation (Non-Medical): No  Physical Activity: Insufficiently Active (08/31/2023)   Exercise Vital Sign    Days of Exercise per Week: 3 days    Minutes of Exercise per Session: 30 min  Stress: No Stress Concern Present (08/31/2023)   [employer] of Occupational Health - Occupational Stress Questionnaire    Feeling of Stress : Not at all  Social Connections: Moderately Integrated (08/31/2023)   Social Connection and Isolation Panel [NHANES]    Frequency of Communication with Friends and Family: Twice a week    Frequency of Social Gatherings with Friends and Family: More than three times a week    Attends Religious Services: More than 4 times per year    Active Member of Clubs or Organizations: No  Attends Banker Meetings: Never    Marital Status: Married    Tobacco Counseling Counseling given: Yes   Clinical Intake:  Pre-visit preparation completed: Yes  Pain : No/denies pain     BMI - recorded: 27.37 Nutritional Risks: None Diabetes: No  How often do you need to have someone help you when you read instructions, pamphlets, or other written materials from your doctor or pharmacy?: 1 - Never  Interpreter Needed?: No  Information entered by ::  Adonis Yim, CMA   Activities of Daily Living    08/29/2023    5:45 PM  In your present state of health, do you have any difficulty performing the following activities:  Hearing? 1  Vision? 0  Difficulty concentrating or making decisions? 0  Walking or climbing stairs? 0  Dressing or bathing? 0  Doing errands, shopping? 0  Preparing Food and eating ? N  Using the Toilet? N  In the past six months, have you accidently leaked urine? N  Do you have problems with loss of bowel control? N  Managing your Medications? N  Managing your Finances? N  Housekeeping or managing your Housekeeping? N    Patient Care Team: Anabel Halon, MD as PCP - General  (Internal Medicine)  Indicate any recent Medical Services you may have received from other than [facility] providers in the past year (date may be approximate).     Assessment:   This is a routine wellness examination for [place].  Hearing/Vision screen Hearing Screening - Comments:: Patient wears hearing aids bilaterally  Vision Screening - Comments:: Wears rx glasses - up to date with routine eye exams  [facility]    Goals Addressed             This Visit's Progress    Patient Stated       Doreatha Martin several wood projects to get ready for next Christmas        Depression Screen    08/31/2023    2:49 PM 06/26/2023   10:21 AM 12/25/2022   11:26 AM 10/01/2022    3:23 PM 08/11/2022    9:37 AM 08/11/2022    9:36 AM 05/27/2022    9:07 AM  PHQ 2/9 Scores  PHQ - 2 Score 0 0 0 0 0 0 0    Fall Risk    08/29/2023    5:45 PM 06/26/2023   10:20 AM 12/25/2022   11:25 AM 10/01/2022    3:23 PM 08/11/2022    9:37 AM  Fall Risk   Falls in the past year? 0 0 0 0 0  Number falls in past yr: 0 0 0 0 0  Injury with Fall? 0 0 0 0 0  Risk for fall due to : No Fall Risks No Fall Risks   No Fall Risks  Follow up Falls prevention discussed Falls evaluation completed   Falls evaluation completed    MEDICARE RISK AT HOME: Medicare Risk at Home Any stairs in or around the home?: (Patient-Rptd) No If so, are there any without handrails?: (Patient-Rptd) No Home free of loose throw rugs in walkways, pet beds, electrical cords, etc?: (Patient-Rptd) Yes Adequate lighting in your home to reduce risk of falls?: (Patient-Rptd) Yes Life alert?: (Patient-Rptd) No Use of a cane, walker or w/c?: (Patient-Rptd) No Grab bars in the bathroom?: (Patient-Rptd) No Shower chair or bench in shower?: (Patient-Rptd) No Elevated toilet seat or a handicapped toilet?: (Patient-Rptd) No  TIMED UP AND GO:  Was the  test performed?  No    Cognitive Function:    08/11/2022    9:38 AM  MMSE - Mini Mental  State Exam  Not completed: Unable to complete        08/31/2023    2:48 PM 08/11/2022    9:38 AM 06/24/2021    4:37 PM 06/24/2021    3:36 PM  6CIT Screen  What Year? 0 points 0 points 0 points 0 points  What month? 0 points 0 points 0 points 0 points  What time? 0 points 0 points 0 points 0 points  Count back from 20 0 points 0 points 0 points 0 points  Months in reverse 0 points 0 points 2 points 2 points  Repeat phrase 0 points 0 points 0 points 0 points  Total Score 0 points 0 points 2 points 2 points    Immunizations Immunization History  Administered Date(s) Administered   PNEUMOCOCCAL CONJUGATE-20 05/15/2021   Zoster Recombinant(Shingrix) 06/17/2022    TDAP status: Up to date  Flu Vaccine status: Due, Education has been provided regarding the importance of this vaccine. Advised may receive this vaccine at local pharmacy or Health Dept. Aware to provide a copy of the vaccination record if obtained from local pharmacy or Health Dept. Verbalized acceptance and understanding.  Pneumococcal vaccine status: Up to date  Covid-19 vaccine status: Information provided on how to obtain vaccines.   Qualifies for Shingles Vaccine? Yes   Zostavax completed No   Shingrix Completed?: Yes  Screening Tests Health Maintenance  Topic Date Due   DTaP/Tdap/Td (1 - Tdap) Never done   Zoster Vaccines- Shingrix (2 of 2) 08/12/2022   COVID-19 Vaccine (1 - 2024-25 season) Never done   Medicare Annual Wellness (AWV)  08/12/2023   INFLUENZA VACCINE  11/30/2023 (Originally 04/02/2023)   Colonoscopy  05/01/2027   Pneumonia Vaccine 86+ Years old  Completed   Hepatitis C Screening  Completed   HPV VACCINES  Aged Out    Health Maintenance  Health Maintenance Due  Topic Date Due   DTaP/Tdap/Td (1 - Tdap) Never done   Zoster Vaccines- Shingrix (2 of 2) 08/12/2022   COVID-19 Vaccine (1 - 2024-25 season) Never done   Medicare Annual Wellness (AWV)  08/12/2023    Colorectal cancer  screening: Type of screening: Colonoscopy. Completed 04/30/2017. Repeat every 10 years  Lung Cancer Screening: (Low Dose CT Chest recommended if Age 69-80 years, 20 pack-year currently smoking OR have quit w/in 15years.) does not qualify.   Lung Cancer Screening Referral: na  Additional Screening:  Hepatitis C Screening: does not qualify; Completed   Vision Screening: Recommended annual ophthalmology exams for early detection of glaucoma and other disorders of the eye. Is the patient up to date with their annual eye exam?  Yes  Who is the provider or what is the name of the office in which the patient attends annual eye exams? [facility] If pt is not established with a provider, would they like to be referred to a provider to establish care? No .   Dental Screening: Recommended annual dental exams for proper oral hygiene  Diabetic Foot Exam: na  Community Resource Referral / Chronic Care Management: CRR required this visit?  No   CCM required this visit?  No     Plan:     I have personally reviewed and noted the following in the patient's chart:   Medical and social history Use of alcohol, tobacco or illicit drugs  Current medications and  supplements including opioid prescriptions. Patient is not currently taking opioid prescriptions. Functional ability and status Nutritional status Physical activity Advanced directives List of other physicians Hospitalizations, surgeries, and ER visits in previous 12 months Vitals Screenings to include cognitive, depression, and falls Referrals and appointments  In addition, I have reviewed and discussed with patient certain preventive protocols, quality metrics, and best practice recommendations. A written personalized care plan for preventive services as well as general preventive health recommendations were provided to patient.     Jordan Hawks Darshawn Boateng, CMA   08/31/2023   After Visit Summary: (MyChart) Due to this being a  telephonic visit, the after visit summary with patients personalized plan was offered to patient via MyChart   Nurse Notes: none

## 2023-10-08 DIAGNOSIS — Z008 Encounter for other general examination: Secondary | ICD-10-CM | POA: Diagnosis not present

## 2023-10-08 DIAGNOSIS — Z87891 Personal history of nicotine dependence: Secondary | ICD-10-CM | POA: Diagnosis not present

## 2023-10-08 DIAGNOSIS — I129 Hypertensive chronic kidney disease with stage 1 through stage 4 chronic kidney disease, or unspecified chronic kidney disease: Secondary | ICD-10-CM | POA: Diagnosis not present

## 2023-10-08 DIAGNOSIS — R7303 Prediabetes: Secondary | ICD-10-CM | POA: Diagnosis not present

## 2023-10-08 DIAGNOSIS — N1832 Chronic kidney disease, stage 3b: Secondary | ICD-10-CM | POA: Diagnosis not present

## 2023-10-08 DIAGNOSIS — Z8249 Family history of ischemic heart disease and other diseases of the circulatory system: Secondary | ICD-10-CM | POA: Diagnosis not present

## 2023-10-08 DIAGNOSIS — J302 Other seasonal allergic rhinitis: Secondary | ICD-10-CM | POA: Diagnosis not present

## 2023-10-08 DIAGNOSIS — M1990 Unspecified osteoarthritis, unspecified site: Secondary | ICD-10-CM | POA: Diagnosis not present

## 2023-10-08 DIAGNOSIS — I252 Old myocardial infarction: Secondary | ICD-10-CM | POA: Diagnosis not present

## 2023-10-08 DIAGNOSIS — J449 Chronic obstructive pulmonary disease, unspecified: Secondary | ICD-10-CM | POA: Diagnosis not present

## 2023-10-08 DIAGNOSIS — J670 Farmer's lung: Secondary | ICD-10-CM | POA: Diagnosis not present

## 2023-10-08 DIAGNOSIS — Z8616 Personal history of COVID-19: Secondary | ICD-10-CM | POA: Diagnosis not present

## 2023-10-08 DIAGNOSIS — E785 Hyperlipidemia, unspecified: Secondary | ICD-10-CM | POA: Diagnosis not present

## 2023-10-14 ENCOUNTER — Other Ambulatory Visit: Payer: Self-pay | Admitting: Internal Medicine

## 2023-10-14 DIAGNOSIS — I10 Essential (primary) hypertension: Secondary | ICD-10-CM

## 2023-10-14 DIAGNOSIS — E7800 Pure hypercholesterolemia, unspecified: Secondary | ICD-10-CM

## 2023-12-25 ENCOUNTER — Ambulatory Visit (INDEPENDENT_AMBULATORY_CARE_PROVIDER_SITE_OTHER): Payer: Medicare HMO | Admitting: Internal Medicine

## 2023-12-25 ENCOUNTER — Encounter: Payer: Self-pay | Admitting: Internal Medicine

## 2023-12-25 VITALS — BP 127/89 | HR 96 | Ht 68.0 in | Wt 179.6 lb

## 2023-12-25 DIAGNOSIS — J4520 Mild intermittent asthma, uncomplicated: Secondary | ICD-10-CM

## 2023-12-25 DIAGNOSIS — N1831 Chronic kidney disease, stage 3a: Secondary | ICD-10-CM | POA: Diagnosis not present

## 2023-12-25 DIAGNOSIS — M48061 Spinal stenosis, lumbar region without neurogenic claudication: Secondary | ICD-10-CM | POA: Diagnosis not present

## 2023-12-25 DIAGNOSIS — J45909 Unspecified asthma, uncomplicated: Secondary | ICD-10-CM | POA: Insufficient documentation

## 2023-12-25 DIAGNOSIS — R7303 Prediabetes: Secondary | ICD-10-CM | POA: Diagnosis not present

## 2023-12-25 DIAGNOSIS — R140 Abdominal distension (gaseous): Secondary | ICD-10-CM | POA: Diagnosis not present

## 2023-12-25 DIAGNOSIS — I10 Essential (primary) hypertension: Secondary | ICD-10-CM

## 2023-12-25 DIAGNOSIS — E782 Mixed hyperlipidemia: Secondary | ICD-10-CM | POA: Diagnosis not present

## 2023-12-25 DIAGNOSIS — Z91030 Bee allergy status: Secondary | ICD-10-CM

## 2023-12-25 MED ORDER — ALBUTEROL SULFATE HFA 108 (90 BASE) MCG/ACT IN AERS
2.0000 | INHALATION_SPRAY | RESPIRATORY_TRACT | 3 refills | Status: AC | PRN
Start: 2023-12-25 — End: ?

## 2023-12-25 MED ORDER — EPINEPHRINE 0.3 MG/0.3ML IJ SOAJ
0.3000 mg | INTRAMUSCULAR | 1 refills | Status: AC | PRN
Start: 2023-12-25 — End: ?

## 2023-12-25 NOTE — Assessment & Plan Note (Signed)
Chronic low back pain improved S/p lumbar fusion surgery Followed by spine surgery Advised to avoid heavy lifting and frequent bending Tylenol as needed Heating pad and/or back brace as needed

## 2023-12-25 NOTE — Assessment & Plan Note (Signed)
 Epigastric discomfort and bloating could be due to gastritis/GERD, but did not get better with omeprazole  -discontinued PPI Has tried simethicone Keep food diary-try lactose-free dairy products Avoid hot and spicy food Referred to GI due to persistent symptoms

## 2023-12-25 NOTE — Progress Notes (Signed)
 Established Patient Office Visit  Subjective:  Patient ID: Kyle Jarvis, male    DOB: Jan 04, 1956  Age: 68 y.o. MRN: [id number]  CC:  Chief Complaint  Patient presents with   Medical Management of Chronic Issues    6 month f/u    HPI Kyle Jarvis is a 68 y.o. male with past medical history of HTN and HLD who presents for annual physical.  HTN: BP is well-controlled at home. Takes medications regularly. Patient denies headache, dizziness, chest pain, dyspnea or palpitations.  HLD: He takes Crestor  now.  His lipid profile has improved now.   Lumbar spinal stenosis: His back pain has improved now since the lumbar spinal surgery.  He denies any numbness, tingling or weakness of the LE.  He has completed PT.   He still reports bloating despite taking omeprazole .  He has tried simethicone now.  He denies any nausea, melena or hematochezia.  He usually has a solid BM in the morning, but later has loose BM after 30 minutes or so.  He feels bloated throughout the day.  Elevated creatinine: His last 2 BMPs showed elevated creatinine and decreasing GFR.  He has increased fluid intake since then.  Denies dysuria, hematuria or urinary hesitancy or resistance.  Of note, he reports that he takes ibuprofen on some days for low back pain.  Past Medical History:  Diagnosis Date   Allergy    COVID-19 04/26/2021   Farmer's lung (HCC)    GERD (gastroesophageal reflux disease)    Hypertension     Past Surgical History:  Procedure Laterality Date   CHOLECYSTECTOMY     FRACTURE SURGERY Right    hand surgery- in [place]    HERNIA REPAIR Bilateral    inguinal   SPLENECTOMY     TONSILLECTOMY     out at age 52   TRANSFORAMINAL LUMBAR INTERBODY FUSION W/ MIS 2 LEVEL N/A 02/25/2022   Procedure: LATERAL LUMBAR INTERBODY FUSION, LEFT  LUMBAR THREE-FOUR, LUMBAR FOUR-FIVE, PRONE TRANSPSOAS, PERCUTANEOUS PEDICLE SCREW FIXATION;  Surgeon: Van Gelinas, MD;  Location: [facility] OR;  Service:  Neurosurgery;  Laterality: N/A;    History reviewed. No pertinent family history.  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, maintenance, Education officer, museum  Tobacco Use   Smoking status: Former    Current packs/day: 0.00    Average packs/day: 1 pack/day for 9.0 years (9.0 ttl pk-yrs)    Types: Cigarettes    Start date: 11    Quit date: 2000    Years since quitting: 25.3   Smokeless tobacco: Former    Types: Chew    Quit date: 05/15/2016  Vaping Use   Vaping status: Never Used  Substance and Sexual Activity   Alcohol use: Yes    Alcohol/week: 28.0 standard drinks of alcohol    Types: 14 Cans of beer, 14 Shots of liquor per week    Comment: trying to cut back   Drug use: Not Currently   Sexual activity: Not Currently    Comment: erectile dysfunction- since hernia surgery  Other Topics Concern   Not on file  Social History Narrative   Moved to [place] 2 years ago to be closer to daughter.   Social Drivers of  Health   Financial Resource Strain: Low Risk  (08/31/2023)   Overall Financial Resource Strain (CARDIA)    Difficulty of Paying Living Expenses: Not hard at all  Food Insecurity: No Food Insecurity (08/31/2023)   Hunger Vital Sign    Worried About Running Out of Food in the Last Year: Never true    Ran Out of Food in the Last Year: Never true  Transportation Needs: No Transportation Needs (08/31/2023)   PRAPARE - Administrator, Civil Service (Medical): No    Lack of Transportation (Non-Medical): No  Physical Activity: Insufficiently Active (08/31/2023)   Exercise Vital Sign    Days of Exercise per Week: 3 days    Minutes of Exercise per Session: 30 min  Stress: No Stress Concern Present (08/31/2023)   [employer] of  Occupational Health - Occupational Stress Questionnaire    Feeling of Stress : Not at all  Social Connections: Moderately Integrated (08/31/2023)   Social Connection and Isolation Panel [NHANES]    Frequency of Communication with Friends and Family: Twice a week    Frequency of Social Gatherings with Friends and Family: More than three times a week    Attends Religious Services: More than 4 times per year    Active Member of [employer] or Organizations: No    Attends Banker Meetings: Never    Marital Status: Married  Catering manager Violence: Not At Risk (08/31/2023)   Humiliation, Afraid, Rape, and Kick questionnaire    Fear of Current or Ex-Partner: No    Emotionally Abused: No    Physically Abused: No    Sexually Abused: No    Outpatient Medications Prior to Visit  Medication Sig Dispense Refill   cholecalciferol  (VITAMIN D3) 25 MCG (1000 UNIT) tablet Take 1,000 Units by mouth daily.     COLLAGEN PO Take 2 capsules by mouth daily.     Multiple Minerals (CALCIUM -MAGNESIUM -ZINC ) TABS Take 1 tablet by mouth daily.     olmesartan  (BENICAR ) 20 MG tablet TAKE 1 TABLET BY MOUTH EVERY DAY 90 tablet 3   ORAL ELECTROLYTES PO Take 1 capsule by mouth daily.     rosuvastatin  (CRESTOR ) 40 MG tablet TAKE 1 TABLET BY MOUTH EVERY DAY 90 tablet 3   sildenafil  (VIAGRA ) 100 MG tablet Take 1 tablet (100 mg total) by mouth daily as needed for erectile dysfunction. 30 tablet prn   albuterol  (VENTOLIN  HFA) 108 (90 Base) MCG/ACT inhaler Inhale 2 puffs into the lungs every 4 (four) hours as needed for wheezing or shortness of breath. 6.7 g 3   clobetasol  cream (TEMOVATE ) 0.05 % Apply 1 Application topically 2 (two) times daily. 30 g 0   EPINEPHrine  0.3 mg/0.3 mL IJ SOAJ injection Inject 0.3 mg into the muscle as needed for anaphylaxis. 1 each 1   No facility-administered medications prior to visit.    Allergies  Allergen Reactions   Bee Venom Anaphylaxis   Hydrocortisone Swelling    Had back  injection, and he had to be wheelchair bound for 3 months   Pollen Extract     Hay Fever    ROS Review of Systems  Constitutional:  Negative for chills and fever.  HENT:  Negative for congestion and sore throat.   Eyes:  Negative for pain and discharge.  Respiratory:  Negative for cough and shortness of breath.   Cardiovascular:  Negative for chest pain and palpitations.  Gastrointestinal:  Negative for diarrhea, nausea and vomiting.  Endocrine: Negative for polydipsia and polyuria.  Genitourinary:  Negative for dysuria and hematuria.  Musculoskeletal:  Positive for arthralgias (Left wrist and hand) and back pain. Negative for neck pain and neck stiffness.  Skin:  Negative for rash.  Neurological:  Negative for dizziness, numbness and headaches.  Psychiatric/Behavioral:  Negative for agitation and behavioral problems.       Objective:    Physical Exam Vitals reviewed.  Constitutional:      General: He is not in acute distress.    Appearance: He is not diaphoretic.  HENT:     Head: Normocephalic and atraumatic.     Nose: Nose normal.     Mouth/Throat:     Mouth: Mucous membranes are moist.  Eyes:     General: No scleral icterus.    Extraocular Movements: Extraocular movements intact.  Cardiovascular:     Rate and Rhythm: Normal rate and regular rhythm.     Heart sounds: Normal heart sounds. No murmur heard. Pulmonary:     Breath sounds: Normal breath sounds. No wheezing or rales.  Abdominal:     Palpations: Abdomen is soft.     Tenderness: There is no abdominal tenderness.  Musculoskeletal:     Cervical back: Neck supple. No tenderness.     Right lower leg: No edema.     Left lower leg: No edema.     Comments: [facility] thumb brace in place on left hand  Feet:     Left foot:     Toenail Condition: Left toenails are ingrown.  Skin:    General: Skin is warm.     Findings: No rash.  Neurological:     General: No focal deficit present.     Mental Status: He is alert and  oriented to person, place, and time.     Sensory: No sensory deficit.     Motor: No weakness.  Psychiatric:        Mood and Affect: Mood normal.        Behavior: Behavior normal.     BP 127/89   Pulse 96   Ht 5\' 8"  (1.727 m)   Wt 179 lb 9.6 oz (81.5 kg)   SpO2 96%   BMI 27.31 kg/m  Wt Readings from Last 3 Encounters:  12/25/23 179 lb 9.6 oz (81.5 kg)  08/31/23 180 lb (81.6 kg)  06/26/23 183 lb 3.2 oz (83.1 kg)    Lab Results  Component Value Date   TSH 2.080 06/19/2023   Lab Results  Component Value Date   WBC 7.7 06/19/2023   HGB 14.5 06/19/2023   HCT 43.3 06/19/2023   MCV 93 06/19/2023   PLT 278 06/19/2023   Lab Results  Component Value Date   NA 143 07/13/2023   K 5.3 (H) 07/13/2023   CO2 24 07/13/2023   GLUCOSE 78 07/13/2023   BUN 19 07/13/2023   CREATININE 1.74 (H) 07/13/2023   BILITOT 0.3 06/19/2023   ALKPHOS 67 06/19/2023   AST 33 06/19/2023   ALT 28 06/19/2023   PROT 7.6 06/19/2023   ALBUMIN 4.5 06/19/2023   CALCIUM  10.7 (H) 07/13/2023   ANIONGAP 10 02/18/2022   EGFR 42 (L) 07/13/2023   Lab Results  Component Value Date   CHOL 213 (H) 06/19/2023   Lab Results  Component Value Date   HDL 86 06/19/2023   Lab Results  Component Value Date   LDLCALC 109 (H) 06/19/2023   Lab Results  Component Value Date   TRIG 102 06/19/2023   Lab Results  Component Value Date  CHOLHDL 2.5 06/19/2023   Lab Results  Component Value Date   HGBA1C 6.3 (H) 06/19/2023      Assessment & Plan:   Problem List Items Addressed This Visit       Cardiovascular and Mediastinum   Essential hypertension - Primary   BP Readings from Last 1 Encounters:  12/25/23 127/89   Well-controlled with Olmesartan  Counseled for compliance with the medications Advised DASH diet and moderate exercise/walking, at least 150 mins/week      Relevant Medications   EPINEPHrine  0.3 mg/0.3 mL IJ SOAJ injection   Other Relevant Orders   Basic Metabolic Panel (BMET)    Parathyroid hormone, intact (no Ca)     Respiratory   Reactive airway disease   Usually has mild dyspnea with pollen exposure Refilled albuterol  inhaler      Relevant Medications   albuterol  (VENTOLIN  HFA) 108 (90 Base) MCG/ACT inhaler     Genitourinary   Stage 3a chronic kidney disease (HCC)   Last 2 BMPs showed elevated creatinine and decreasing GFR, up to 42 Had advised to increase fluid intake Avoid nephrotoxic agents, including NSAIDs Needs to avoid ibuprofen and take Tylenol  arthritis instead for back pain Check BMP and PTH        Other   Bee sting allergy   Has anaphylactoid reaction to bee sting Refilled EpiPen       Relevant Medications   EPINEPHrine  0.3 mg/0.3 mL IJ SOAJ injection   Mixed hyperlipidemia   Continue Crestor  Checked lipid profile      Relevant Medications   EPINEPHrine  0.3 mg/0.3 mL IJ SOAJ injection   Lumbar spinal stenosis   Chronic low back pain improved S/p lumbar fusion surgery Followed by spine surgery Advised to avoid heavy lifting and frequent bending Tylenol  as needed Heating pad and/or back brace as needed      Bloating   Epigastric discomfort and bloating could be due to gastritis/GERD, but did not get better with omeprazole  -discontinued PPI Has tried simethicone Keep food diary-try lactose-free dairy products Avoid hot and spicy food Referred to GI due to persistent symptoms      Relevant Orders   Ambulatory referral to Gastroenterology   Prediabetes   Lab Results  Component Value Date   HGBA1C 6.3 (H) 06/19/2023   Advised to follow DASH diet      Relevant Orders   Hemoglobin A1c    Meds ordered this encounter  Medications   albuterol  (VENTOLIN  HFA) 108 (90 Base) MCG/ACT inhaler    Sig: Inhale 2 puffs into the lungs every 4 (four) hours as needed for wheezing or shortness of breath.    Dispense:  18 g    Refill:  3   EPINEPHrine  0.3 mg/0.3 mL IJ SOAJ injection    Sig: Inject 0.3 mg into the muscle as needed  for anaphylaxis.    Dispense:  1 each    Refill:  1    Follow-up: Return in about 6 months (around 06/25/2024) for Annual physical (after 06/25/24).    Meldon Sport, MD

## 2023-12-25 NOTE — Assessment & Plan Note (Signed)
Lab Results  Component Value Date   HGBA1C 6.3 (H) 06/19/2023   Advised to follow DASH diet

## 2023-12-25 NOTE — Assessment & Plan Note (Signed)
 Last 2 BMPs showed elevated creatinine and decreasing GFR, up to 42 Had advised to increase fluid intake Avoid nephrotoxic agents, including NSAIDs Needs to avoid ibuprofen and take Tylenol  arthritis instead for back pain Check BMP and PTH

## 2023-12-25 NOTE — Assessment & Plan Note (Signed)
 Has anaphylactoid reaction to bee sting Refilled EpiPen 

## 2023-12-25 NOTE — Assessment & Plan Note (Signed)
 Usually has mild dyspnea with pollen exposure Refilled albuterol  inhaler

## 2023-12-25 NOTE — Assessment & Plan Note (Signed)
Continue Crestor Checked lipid profile 

## 2023-12-25 NOTE — Assessment & Plan Note (Signed)
 BP Readings from Last 1 Encounters:  12/25/23 127/89   Well-controlled with Olmesartan  Counseled for compliance with the medications Advised DASH diet and moderate exercise/walking, at least 150 mins/week

## 2023-12-25 NOTE — Patient Instructions (Signed)
 Please maintain at least 64 ounces of fluid intake in a day.  Please continue to take medications as prescribed.  Please continue to follow low salt diet and perform moderate exercise/walking at least 150 mins/week.

## 2023-12-27 LAB — BASIC METABOLIC PANEL WITH GFR
BUN/Creatinine Ratio: 17 (ref 10–24)
BUN: 27 mg/dL (ref 8–27)
CO2: 20 mmol/L (ref 20–29)
Calcium: 10.3 mg/dL — ABNORMAL HIGH (ref 8.6–10.2)
Chloride: 104 mmol/L (ref 96–106)
Creatinine, Ser: 1.61 mg/dL — ABNORMAL HIGH (ref 0.76–1.27)
Glucose: 78 mg/dL (ref 70–99)
Potassium: 4.9 mmol/L (ref 3.5–5.2)
Sodium: 141 mmol/L (ref 134–144)
eGFR: 46 mL/min/{1.73_m2} — ABNORMAL LOW (ref >59)

## 2023-12-27 LAB — HEMOGLOBIN A1C
Est. average glucose Bld gHb Est-mCnc: 123 mg/dL
Hgb A1c MFr Bld: 5.9 % — ABNORMAL HIGH (ref 4.8–5.6)

## 2023-12-27 LAB — PARATHYROID HORMONE, INTACT (NO CA): PTH: 21 pg/mL (ref 15–65)

## 2023-12-28 ENCOUNTER — Encounter: Payer: Self-pay | Admitting: Internal Medicine

## 2023-12-28 ENCOUNTER — Other Ambulatory Visit: Payer: Self-pay | Admitting: Internal Medicine

## 2023-12-28 DIAGNOSIS — N1831 Chronic kidney disease, stage 3a: Secondary | ICD-10-CM

## 2023-12-30 ENCOUNTER — Encounter (INDEPENDENT_AMBULATORY_CARE_PROVIDER_SITE_OTHER): Payer: Self-pay | Admitting: *Deleted

## 2024-02-15 ENCOUNTER — Encounter (INDEPENDENT_AMBULATORY_CARE_PROVIDER_SITE_OTHER): Payer: Self-pay | Admitting: Gastroenterology

## 2024-02-15 ENCOUNTER — Ambulatory Visit (INDEPENDENT_AMBULATORY_CARE_PROVIDER_SITE_OTHER): Admitting: Gastroenterology

## 2024-02-15 VITALS — BP 132/79 | HR 118 | Temp 97.5°F | Ht 68.0 in | Wt 179.7 lb

## 2024-02-15 DIAGNOSIS — R143 Flatulence: Secondary | ICD-10-CM | POA: Insufficient documentation

## 2024-02-15 DIAGNOSIS — R140 Abdominal distension (gaseous): Secondary | ICD-10-CM

## 2024-02-15 NOTE — Progress Notes (Unsigned)
 Samantha Cress, M.D. Gastroenterology & Hepatology [facility] Gastroenterology [address] [place], [place] [address] Primary Care Physician: Meldon Sport, MD [address] [place] [address]  Referring MD: PCP  Chief Complaint: Bloating and flatulence.  History of Present Illness: Kyle Jarvis is a 68 y.o. male with a past medical history of hypertension, GERD and farmer's lung, who presents for evaluation of bloating and flatulence.  Patient reports that for the last year. States that after he eats any type of food, he will be having frequent flatulence without malodorous smell. He also has frequent burping. He also feels frequent bloating, which actually improves after he passes gas.He states that he does not wakeup bloated but he may have gas even if he has not eaten.   He has tried Gas-X without improvement. Also tried going lactose free for a month without improvement.  The patient denies having any nausea, vomiting, fever, chills, hematochezia, melena, hematemesis, abdominal pain, diarrhea, jaundice, pruritus . Has gained 30 lb for the last 2 years after he had a surgery.   Last YQM:VHQIO Last Colonoscopy: Performed at [facility] on 04/30/2017 by Dr. Cassondra Cliff.  Normal colonoscopy.  Recommended repeat in 10 years.  FHx: neg for any gastrointestinal/liver disease, father throat cancer Social: quit  smoking cigs in the 80s and stopped chewing tobacco 5 years ago,drinks liquor 2 drinks per day, neg illicit drug use Surgical: cholecystectomy, splenectomy, abdominal hernia with mesh   Past Medical History: Past Medical History:  Diagnosis Date   Allergy    COVID-19 04/26/2021   Farmer's lung (HCC)    GERD (gastroesophageal reflux disease)    Hypertension     Past Surgical History: Past Surgical History:  Procedure Laterality Date   CHOLECYSTECTOMY     FRACTURE SURGERY Right    hand surgery- in [place]    HERNIA  REPAIR Bilateral    inguinal   SPLENECTOMY     TONSILLECTOMY     out at age 67   TRANSFORAMINAL LUMBAR INTERBODY FUSION W/ MIS 2 LEVEL N/A 02/25/2022   Procedure: LATERAL LUMBAR INTERBODY FUSION, LEFT  LUMBAR THREE-FOUR, LUMBAR FOUR-FIVE, PRONE TRANSPSOAS, PERCUTANEOUS PEDICLE SCREW FIXATION;  Surgeon: Van Gelinas, MD;  Location: [facility] OR;  Service: Neurosurgery;  Laterality: N/A;    Family History:History reviewed. No pertinent family history.  Social History: Social History   Tobacco Use  Smoking Status Former   Current packs/day: 0.00   Average packs/day: 1 pack/day for 9.0 years (9.0 ttl pk-yrs)   Types: Cigarettes   Start date: 12   Quit date: 2000   Years since quitting: 25.4  Smokeless Tobacco Former   Types: Chew   Quit date: 05/15/2016   Social History   Substance and Sexual Activity  Alcohol Use Yes   Alcohol/week: 28.0 standard drinks of alcohol   Types: 14 Cans of beer, 14 Shots of liquor per week   Comment: trying to cut back   Social History   Substance and Sexual Activity  Drug Use Not Currently    Allergies: Allergies  Allergen Reactions   Bee Venom Anaphylaxis   Hydrocortisone Swelling    Had back injection, and he had to be wheelchair bound for 3 months   Pollen Extract     Hay Fever    Medications: Current Outpatient Medications  Medication Sig Dispense Refill   albuterol  (VENTOLIN  HFA) 108 (90 Base) MCG/ACT inhaler Inhale 2 puffs into the lungs every 4 (four) hours as needed for  wheezing or shortness of breath. 18 g 3   cholecalciferol  (VITAMIN D3) 25 MCG (1000 UNIT) tablet Take 1,000 Units by mouth daily.     COLLAGEN PO Take 2 capsules by mouth daily.     EPINEPHrine  0.3 mg/0.3 mL IJ SOAJ injection Inject 0.3 mg into the muscle as needed for anaphylaxis. 1 each 1   Multiple Minerals (CALCIUM -MAGNESIUM -ZINC ) TABS Take 1 tablet by mouth daily.     olmesartan  (BENICAR ) 20 MG tablet TAKE 1 TABLET BY MOUTH EVERY DAY 90 tablet 3   ORAL  ELECTROLYTES PO Take 1 capsule by mouth daily. (Patient taking differently: Take 1 capsule by mouth daily. May take additional if sweating more than usual.)     rosuvastatin  (CRESTOR ) 40 MG tablet TAKE 1 TABLET BY MOUTH EVERY DAY 90 tablet 3   sildenafil  (VIAGRA ) 100 MG tablet Take 1 tablet (100 mg total) by mouth daily as needed for erectile dysfunction. 30 tablet prn   No current facility-administered medications for this visit.    Review of Systems: GENERAL: negative for malaise, night sweats HEENT: No changes in hearing or vision, no nose bleeds or other nasal problems. NECK: Negative for lumps, goiter, pain and significant neck swelling RESPIRATORY: Negative for cough, wheezing CARDIOVASCULAR: Negative for chest pain, leg swelling, palpitations, orthopnea GI: SEE HPI MUSCULOSKELETAL: Negative for joint pain or swelling, back pain, and muscle pain. SKIN: Negative for lesions, rash PSYCH: Negative for sleep disturbance, mood disorder and recent psychosocial stressors. HEMATOLOGY Negative for prolonged bleeding, bruising easily, and swollen nodes. ENDOCRINE: Negative for cold or heat intolerance, polyuria, polydipsia and goiter. NEURO: negative for tremor, gait imbalance, syncope and seizures. The remainder of the review of systems is noncontributory.   Physical Exam: BP 132/79 (BP Location: Left Arm, Patient Position: Sitting, Cuff Size: Large)   Pulse (!) 118   Temp (!) 97.5 F (36.4 C) (Temporal)   Ht 5' 8 (1.727 m)   Wt 179 lb 11.2 oz (81.5 kg)   BMI 27.32 kg/m  GENERAL: The patient is AO x3, in no acute distress. HEENT: Head is normocephalic and atraumatic. EOMI are intact. Mouth is well hydrated and without lesions. NECK: Supple. No masses LUNGS: Clear to auscultation. No presence of rhonchi/wheezing/rales. Adequate chest expansion HEART: RRR, normal s1 and s2. ABDOMEN: mildly tender in the R flank, no guarding, no peritoneal signs, and nondistended. BS +. No  masses. EXTREMITIES: Without any cyanosis, clubbing, rash, lesions or edema. NEUROLOGIC: AOx3, no focal motor deficit. SKIN: no jaundice, no rashes   Imaging/Labs: as above  I personally reviewed and interpreted the available labs, imaging and endoscopic files.  Impression and Plan: Kyle Jarvis is a 68 y.o. male with a past medical history of hypertension, GERD and farmer's lung, who presents for evaluation of bloating and flatulence.  Patient has presented persistent recurrent episodes of flatulence and bloating without other associated symptoms or red flag signs.  He has not been able to pinpoint specific food triggers for this.  Explained that it is possible he may have developed some irritable bowel syndrome, for which he will benefit from some dietary modifications and use of peppermint as needed.  Will also check serologies for celiac disease today.  Ultimately, if he has persistent symptoms we will need to proceed with EGD and possible SIBO testing for evaluation of other organic etiologies.  - Patient was counseled about the benefit of implementing a low FODMAP to improve symptoms and recurrent episodes. A dietary list was provided to the patient. -  Celiac disease serology -Can take peppermint as needed for bloating.  Can use peppermint tea as well. -If persistent symptoms on follow-up, will consider performing EGD and eventual SIBO breath testing  All questions were answered.      Samantha Cress, MD Gastroenterology and Hepatology [facility] Gastroenterology

## 2024-02-15 NOTE — Patient Instructions (Signed)
 Patient was counseled about the benefit of implementing a low FODMAP to improve symptoms and recurrent episodes. A dietary list was provided to the patient. Perform blood workup Can take peppermint as needed for bloating.  Can use peppermint tea as well. If persistent symptoms on follow-up, will consider performing EGD and eventual SIBO breath testing

## 2024-02-27 DIAGNOSIS — N1831 Chronic kidney disease, stage 3a: Secondary | ICD-10-CM | POA: Diagnosis not present

## 2024-02-27 DIAGNOSIS — I129 Hypertensive chronic kidney disease with stage 1 through stage 4 chronic kidney disease, or unspecified chronic kidney disease: Secondary | ICD-10-CM | POA: Diagnosis not present

## 2024-02-27 DIAGNOSIS — E782 Mixed hyperlipidemia: Secondary | ICD-10-CM | POA: Diagnosis not present

## 2024-02-29 ENCOUNTER — Other Ambulatory Visit (HOSPITAL_COMMUNITY): Payer: Self-pay | Admitting: Nephrology

## 2024-02-29 DIAGNOSIS — N200 Calculus of kidney: Secondary | ICD-10-CM

## 2024-02-29 DIAGNOSIS — N1831 Chronic kidney disease, stage 3a: Secondary | ICD-10-CM

## 2024-02-29 DIAGNOSIS — I129 Hypertensive chronic kidney disease with stage 1 through stage 4 chronic kidney disease, or unspecified chronic kidney disease: Secondary | ICD-10-CM

## 2024-03-03 ENCOUNTER — Ambulatory Visit (HOSPITAL_COMMUNITY)
Admission: RE | Admit: 2024-03-03 | Discharge: 2024-03-03 | Disposition: A | Discharge status: Home / Self Care | Source: Ambulatory Visit | Attending: Nephrology | Admitting: Nephrology

## 2024-03-03 DIAGNOSIS — N200 Calculus of kidney: Secondary | ICD-10-CM | POA: Insufficient documentation

## 2024-03-03 DIAGNOSIS — N189 Chronic kidney disease, unspecified: Secondary | ICD-10-CM | POA: Diagnosis not present

## 2024-03-03 DIAGNOSIS — I129 Hypertensive chronic kidney disease with stage 1 through stage 4 chronic kidney disease, or unspecified chronic kidney disease: Secondary | ICD-10-CM | POA: Diagnosis not present

## 2024-03-03 DIAGNOSIS — N1831 Chronic kidney disease, stage 3a: Secondary | ICD-10-CM | POA: Insufficient documentation

## 2024-05-04 ENCOUNTER — Encounter (INDEPENDENT_AMBULATORY_CARE_PROVIDER_SITE_OTHER): Payer: Self-pay | Admitting: Gastroenterology

## 2024-05-20 DIAGNOSIS — R140 Abdominal distension (gaseous): Secondary | ICD-10-CM | POA: Diagnosis not present

## 2024-05-20 DIAGNOSIS — R143 Flatulence: Secondary | ICD-10-CM | POA: Diagnosis not present

## 2024-06-02 DIAGNOSIS — I129 Hypertensive chronic kidney disease with stage 1 through stage 4 chronic kidney disease, or unspecified chronic kidney disease: Secondary | ICD-10-CM | POA: Diagnosis not present

## 2024-06-02 DIAGNOSIS — E875 Hyperkalemia: Secondary | ICD-10-CM | POA: Diagnosis not present

## 2024-06-02 DIAGNOSIS — N1831 Chronic kidney disease, stage 3a: Secondary | ICD-10-CM | POA: Diagnosis not present

## 2024-06-09 ENCOUNTER — Ambulatory Visit (INDEPENDENT_AMBULATORY_CARE_PROVIDER_SITE_OTHER): Admitting: Gastroenterology

## 2024-06-09 VITALS — BP 103/70 | HR 90 | Temp 97.5°F | Ht 68.0 in | Wt 178.0 lb

## 2024-06-09 DIAGNOSIS — R195 Other fecal abnormalities: Secondary | ICD-10-CM

## 2024-06-09 DIAGNOSIS — R140 Abdominal distension (gaseous): Secondary | ICD-10-CM | POA: Diagnosis not present

## 2024-06-09 NOTE — Patient Instructions (Signed)
 Im glad you are feeling better I am providing the low FODMAP Diet which can help pinpoint certain foods that may cause more bloating/looser stools.   Let me know if symptoms worsen or you develop any new symptoms  Follow up 6 months  It was a pleasure to see you today. I want to create trusting relationships with patients and provide genuine, compassionate, and quality care. I truly value your feedback! please be on the lookout for a survey regarding your visit with me today. I appreciate your input about our visit and your time in completing this!    Kimla Furth L. Burhanuddin Kohlmann, MSN, APRN, AGNP-C Adult-Gerontology Nurse Practitioner [facility] Gastroenterology at [facility]

## 2024-06-09 NOTE — Progress Notes (Addendum)
 Referring Provider: Tobie Suzzane POUR, MD Primary Care Physician:  Tobie Suzzane POUR, MD Primary GI Physician:Dr. Eartha    Chief Complaint  Patient presents with   Follow-up    Doing good.   HPI:   Kyle Jarvis is a 68 y.o. male with past medical history of  hypertension, GERD and farmer's lung   Patient presenting today for:  Follow up of gas and bloating  Last seen June, at that time by Dr. Eartha. Reported frequent belching, bloating. Tried gas x without improvement.   Recommended celiac disease panel, low FODMAP diet, peppermint oil for bloating, consider EGD/SIBO testing if not improving  Celiac panel negative, IGA elevated (had further autoimmune workup with nephrology)   Present:  Reports feeling some better. He has occasional looser stools. States he tried to eliminate dairy without improvement. He may have looser stools twice per week. He denies any pain in his abdomen. He does not have much bloating at this time. Not currently taking anything. Has very rare GERD symptoms. No nausea or vomiting. No rectal bleeding or melena. He has changed his eating habits some, doing smaller meals during the day instead of one larger meal at breakfast and one larger meal at dinner. He reports he is trying to get his weight down to 160 lbs. He tries to stay active and does a lot of things around his neighnorbood   Last ZHI:wzczm Last Colonoscopy: Performed at [facility] on 04/30/2017 by Dr. Bethel Argyle.  Normal colonoscopy.  Recommended repeat in 10 years.  Past Medical History:  Diagnosis Date   Allergy    COVID-19 04/26/2021   Farmer's lung (HCC)    GERD (gastroesophageal reflux disease)    Hypertension     Past Surgical History:  Procedure Laterality Date   CHOLECYSTECTOMY     FRACTURE SURGERY Right    hand surgery- in [place]    HERNIA REPAIR Bilateral    inguinal   SPLENECTOMY     TONSILLECTOMY     out at age 51   TRANSFORAMINAL LUMBAR INTERBODY FUSION  W/ MIS 2 LEVEL N/A 02/25/2022   Procedure: LATERAL LUMBAR INTERBODY FUSION, LEFT  LUMBAR THREE-FOUR, LUMBAR FOUR-FIVE, PRONE TRANSPSOAS, PERCUTANEOUS PEDICLE SCREW FIXATION;  Surgeon: Debby Dorn MATSU, MD;  Location: [facility] OR;  Service: Neurosurgery;  Laterality: N/A;    Current Outpatient Medications  Medication Sig Dispense Refill   albuterol  (VENTOLIN  HFA) 108 (90 Base) MCG/ACT inhaler Inhale 2 puffs into the lungs every 4 (four) hours as needed for wheezing or shortness of breath. 18 g 3   cholecalciferol  (VITAMIN D3) 25 MCG (1000 UNIT) tablet Take 1,000 Units by mouth daily.     COLLAGEN PO Take 2 capsules by mouth daily.     EPINEPHrine  0.3 mg/0.3 mL IJ SOAJ injection Inject 0.3 mg into the muscle as needed for anaphylaxis. 1 each 1   Multiple Minerals (CALCIUM -MAGNESIUM -ZINC ) TABS Take 1 tablet by mouth daily.     olmesartan  (BENICAR ) 20 MG tablet TAKE 1 TABLET BY MOUTH EVERY DAY 90 tablet 3   ORAL ELECTROLYTES PO Take 1 capsule by mouth daily.     rosuvastatin  (CRESTOR ) 40 MG tablet TAKE 1 TABLET BY MOUTH EVERY DAY 90 tablet 3   sildenafil  (VIAGRA ) 100 MG tablet Take 1 tablet (100 mg total) by mouth daily as needed for erectile dysfunction. 30 tablet prn   No current facility-administered medications for this visit.    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, maintenance, Education officer, museum  Tobacco Use   Smoking status: Former    Current packs/day: 0.00    Average packs/day: 1 pack/day for 9.0 years (9.0 ttl pk-yrs)    Types: Cigarettes    Start date: 52    Quit date: 2000     Years since quitting: 25.7   Smokeless tobacco: Former    Types: Chew    Quit date: 05/15/2016  Vaping Use   Vaping status: Never Used  Substance and Sexual Activity   Alcohol use: Yes    Alcohol/week: 28.0 standard drinks of alcohol    Types: 14 Cans of beer, 14 Shots of liquor per week    Comment: trying to cut back   Drug use: Not Currently   Sexual activity: Not Currently    Comment: erectile dysfunction- since hernia surgery  Other Topics Concern   Not on file  Social History Narrative   Moved to [place] 2 years ago to be closer to daughter.   Social Drivers of Corporate investment banker Strain: Low Risk  (08/31/2023)   Overall Financial Resource Strain (CARDIA)    Difficulty of Paying Living Expenses: Not hard at all  Food Insecurity: No Food Insecurity (08/31/2023)   Hunger Vital Sign    Worried About Running Out of Food in the Last Year: Never true    Ran Out of Food in the Last Year: Never true  Transportation Needs: No Transportation Needs (08/31/2023)   PRAPARE - Administrator, Civil Service (Medical): No    Lack of Transportation (Non-Medical): No  Physical Activity: Insufficiently Active (08/31/2023)   Exercise Vital Sign    Days of Exercise per Week: 3 days    Minutes of Exercise per Session: 30 min  Stress: No Stress Concern Present (08/31/2023)   [employer] of Occupational Health - Occupational Stress Questionnaire    Feeling of Stress : Not at all  Social Connections: Moderately Integrated (08/31/2023)   Social Connection and Isolation Panel    Frequency of Communication with Friends and Family: Twice a week    Frequency of Social Gatherings with Friends and Family: More than three times a week    Attends Religious Services: More than 4 times per year    Active Member of [employer] or Organizations: No    Attends Engineer, structural: Never    Marital Status: Married    Review of systems General: negative for malaise,  night sweats, fever, chills, weight loss Neck: Negative for lumps, goiter, pain and significant neck swelling Resp: Negative for cough, wheezing, dyspnea at rest CV: Negative for chest pain, leg swelling, palpitations, orthopnea GI: denies melena, hematochezia, nausea, vomiting, diarrhea, constipation, dysphagia, odyonophagia, early satiety or unintentional weight loss. +occasional looser stools +occasional bloating  MSK: Negative for joint pain or swelling, back pain, and muscle pain. Derm: Negative for itching or rash Psych: Denies depression, anxiety, memory loss, confusion. No homicidal or suicidal ideation.  Heme: Negative for prolonged bleeding, bruising easily, and swollen nodes. Endocrine: Negative for cold or heat intolerance, polyuria, polydipsia and goiter. Neuro:  negative for tremor, gait imbalance, syncope and seizures. The remainder of the review of systems is noncontributory.  Physical Exam: BP 103/70 (BP Location: Right Arm, Patient Position: Sitting, Cuff Size: Normal)   Pulse 90   Temp (!) 97.5 F (36.4 C) (Oral)   Ht 5' 8 (1.727 m)   Wt 178 lb (80.7 kg)   BMI 27.06 kg/m  General:   Alert and oriented. No distress noted. Pleasant and cooperative.  Head:  Normocephalic and atraumatic. Eyes:  Conjuctiva clear without scleral icterus. Mouth:  Oral mucosa pink and moist. Good dentition. No lesions. Heart: Normal rate and rhythm, s1 and s2 heart sounds present.  Lungs: Clear lung sounds in all lobes. Respirations equal and unlabored. Abdomen:  +BS, soft, non-tender and non-distended. No rebound or guarding. No HSM or masses noted. Derm: No palmar erythema or jaundice Msk:  Symmetrical without gross deformities. Normal posture. Extremities:  Without edema. Neurologic:  Alert and  oriented x4 Psych:  Alert and cooperative. Normal mood and affect.  Invalid input(s): 6 MONTHS   ASSESSMENT: Lakoda A. Blagg is a 68 y.o. male presenting today for follow up of  bloating and loose stools  Patient reports bloating has improved. Not currently taking anything. No abdominal pain. Has some looser stools a few times per week. Has changed his diet and lost some weight doing this. Has not been able to pinpoint any specific foods that make him have looser stools. No rectal bleeding or melena. Does not think he tried low FODMAP diet provided at last visit. Celiac panel was negative. Given he feels symptoms have improved, will hold off on further testing for now. Consider EGD/SIBO breath testing if symptoms recur. I will provide the low FODMAP diet which may be helpful in pinpoint specific triggering foods.    PLAN:  -continue dietary changes -low FODMAP diet -consider EGD/SIBO testing if symptoms recur   All questions were answered, patient verbalized understanding and is in agreement with plan as outlined above.   Follow Up: 6 months   Jancy Sprankle L. Mariette, MSN, APRN, AGNP-C Adult-Gerontology Nurse Practitioner [facility]  I have reviewed the note and agree with the APP's assessment as described in this progress note  Toribio Fortune, MD Gastroenterology and Hepatology [facility] Gastroenterology

## 2024-06-15 ENCOUNTER — Encounter (INDEPENDENT_AMBULATORY_CARE_PROVIDER_SITE_OTHER): Payer: Self-pay | Admitting: Gastroenterology

## 2024-07-08 ENCOUNTER — Ambulatory Visit: Admitting: Internal Medicine

## 2024-07-08 ENCOUNTER — Encounter: Payer: Self-pay | Admitting: Internal Medicine

## 2024-07-08 VITALS — BP 119/87 | HR 60 | Ht 68.0 in | Wt 180.6 lb

## 2024-07-08 DIAGNOSIS — M19042 Primary osteoarthritis, left hand: Secondary | ICD-10-CM

## 2024-07-08 DIAGNOSIS — Z0001 Encounter for general adult medical examination with abnormal findings: Secondary | ICD-10-CM | POA: Diagnosis not present

## 2024-07-08 DIAGNOSIS — E559 Vitamin D deficiency, unspecified: Secondary | ICD-10-CM

## 2024-07-08 DIAGNOSIS — E782 Mixed hyperlipidemia: Secondary | ICD-10-CM

## 2024-07-08 DIAGNOSIS — R7303 Prediabetes: Secondary | ICD-10-CM | POA: Diagnosis not present

## 2024-07-08 DIAGNOSIS — N1831 Chronic kidney disease, stage 3a: Secondary | ICD-10-CM | POA: Diagnosis not present

## 2024-07-08 DIAGNOSIS — Z125 Encounter for screening for malignant neoplasm of prostate: Secondary | ICD-10-CM | POA: Diagnosis not present

## 2024-07-08 DIAGNOSIS — I10 Essential (primary) hypertension: Secondary | ICD-10-CM | POA: Diagnosis not present

## 2024-07-08 NOTE — Patient Instructions (Signed)
 Please continue to take medications as prescribed.  Please continue to follow low carb diet and perform moderate exercise/walking at least 150 mins/week.

## 2024-07-08 NOTE — Assessment & Plan Note (Signed)
 Lab Results  Component Value Date   HGBA1C 5.9 (H) 12/25/2023   Advised to follow DASH diet

## 2024-07-08 NOTE — Assessment & Plan Note (Signed)
 Last 2 BMPs showed elevated creatinine and decreasing GFR Had advised to increase fluid intake Avoid nephrotoxic agents, including NSAIDs Needs to avoid ibuprofen and take Tylenol  arthritis instead for back pain Check CMP and CBC If persistent decline, will refer to nephrology

## 2024-07-08 NOTE — Assessment & Plan Note (Addendum)
 Ordered PSA after discussing its limitations for prostate cancer screening, including false positive results leading to additional investigations.

## 2024-07-08 NOTE — Progress Notes (Addendum)
 Established Patient Office Visit  Subjective:  Patient ID: Kyle Jarvis, male    DOB: 25-Mar-1956  Age: 68 y.o. MRN: [id number]  CC:  Chief Complaint  Patient presents with   Annual Exam    Cpe     HPI Kyle Jarvis is a 68 y.o. male with past medical history of HTN and HLD who presents for annual physical.  HTN: BP is well-controlled at home. Takes medications regularly. Patient denies headache, dizziness, chest pain, dyspnea or palpitations.  HLD: He takes Crestor  now.  His lipid profile has improved now.   Lumbar spinal stenosis: His back pain has improved now since the lumbar spinal surgery.  He denies any numbness, tingling or weakness of the LE.  He has completed PT.   CKD: His last 2 BMPs showed elevated creatinine and decreasing GFR.  He has increased fluid intake since then.  He has also quit soft drinks.  Denies dysuria, hematuria or urinary hesitancy or resistance.  Of note, he reports that he takes ibuprofen on some days for low back pain.  Past Medical History:  Diagnosis Date   Allergy    COVID-19 04/26/2021   Farmer's lung (HCC)    GERD (gastroesophageal reflux disease)    Hypertension     Past Surgical History:  Procedure Laterality Date   CHOLECYSTECTOMY     FRACTURE SURGERY Right    hand surgery- in [place]    HERNIA REPAIR Bilateral    inguinal   SPLENECTOMY     TONSILLECTOMY     out at age 89   TRANSFORAMINAL LUMBAR INTERBODY FUSION W/ MIS 2 LEVEL N/A 02/25/2022   Procedure: LATERAL LUMBAR INTERBODY FUSION, LEFT  LUMBAR THREE-FOUR, LUMBAR FOUR-FIVE, PRONE TRANSPSOAS, PERCUTANEOUS PEDICLE SCREW FIXATION;  Surgeon: Debby Dorn MATSU, MD;  Location: [facility] OR;  Service: Neurosurgery;  Laterality: N/A;    History reviewed. No pertinent family history.  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, maintenance, education officer, museum  Tobacco Use   Smoking status: Former    Current packs/day: 0.00    Average packs/day: 1 pack/day for 9.0 years (9.0 ttl pk-yrs)    Types: Cigarettes    Start date: 29    Quit date: 2000    Years since quitting: 25.8   Smokeless tobacco: Former    Types: Chew    Quit date: 05/15/2016  Vaping Use   Vaping status: Never Used  Substance and Sexual Activity   Alcohol use: Yes    Alcohol/week: 28.0 standard drinks of alcohol    Types: 14 Cans of beer, 14 Shots of liquor per week    Comment: trying to cut back   Drug use: Not Currently   Sexual activity: Not Currently    Comment: erectile dysfunction- since hernia surgery  Other Topics Concern   Not on file  Social History Narrative   Moved to [place] 2 years ago to be closer to daughter.   Social Drivers of Corporate Investment Banker Strain: Low Risk  (07/01/2024)   Overall Financial Resource Strain (CARDIA)    Difficulty of Paying Living Expenses: Not hard at all  Food Insecurity: No Food Insecurity (07/01/2024)   Hunger Vital Sign    Worried About Running  Out of Food in the Last Year: Never true    Ran Out of Food in the Last Year: Never true  Transportation Needs: No Transportation Needs (07/01/2024)   PRAPARE - Administrator, Civil Service (Medical): No    Lack of Transportation (Non-Medical): No  Physical Activity: Sufficiently Active (07/01/2024)   Exercise Vital Sign    Days of Exercise per Week: 6 days    Minutes of Exercise per Session: 60 min  Stress: No Stress Concern Present (07/01/2024)   [employer] of Occupational Health - Occupational Stress Questionnaire    Feeling of Stress: Not at all  Social Connections: Socially Integrated (07/01/2024)   Social Connection and Isolation Panel    Frequency of Communication with Friends and Family: More than three times a week    Frequency  of Social Gatherings with Friends and Family: Twice a week    Attends Religious Services: More than 4 times per year    Active Member of [employer] or Organizations: Yes    Attends Engineer, Structural: More than 4 times per year    Marital Status: Married  Catering Manager Violence: Not At Risk (08/31/2023)   Humiliation, Afraid, Rape, and Kick questionnaire    Fear of Current or Ex-Partner: No    Emotionally Abused: No    Physically Abused: No    Sexually Abused: No    Outpatient Medications Prior to Visit  Medication Sig Dispense Refill   albuterol  (VENTOLIN  HFA) 108 (90 Base) MCG/ACT inhaler Inhale 2 puffs into the lungs every 4 (four) hours as needed for wheezing or shortness of breath. 18 g 3   cholecalciferol  (VITAMIN D3) 25 MCG (1000 UNIT) tablet Take 1,000 Units by mouth daily.     COLLAGEN PO Take 2 capsules by mouth daily.     EPINEPHrine  0.3 mg/0.3 mL IJ SOAJ injection Inject 0.3 mg into the muscle as needed for anaphylaxis. 1 each 1   Multiple Minerals (CALCIUM -MAGNESIUM -ZINC ) TABS Take 1 tablet by mouth daily.     olmesartan  (BENICAR ) 20 MG tablet TAKE 1 TABLET BY MOUTH EVERY DAY 90 tablet 3   ORAL ELECTROLYTES PO Take 1 capsule by mouth daily.     rosuvastatin  (CRESTOR ) 40 MG tablet TAKE 1 TABLET BY MOUTH EVERY DAY 90 tablet 3   sildenafil  (VIAGRA ) 100 MG tablet Take 1 tablet (100 mg total) by mouth daily as needed for erectile dysfunction. 30 tablet prn   No facility-administered medications prior to visit.    Allergies  Allergen Reactions   Bee Venom Anaphylaxis   Hydrocortisone Swelling    Had back injection, and he had to be wheelchair bound for 3 months   Pollen Extract     Hay Fever    ROS Review of Systems  Constitutional:  Negative for chills and fever.  HENT:  Negative for congestion and sore throat.   Eyes:  Negative for pain and discharge.  Respiratory:  Negative for cough and shortness of breath.   Cardiovascular:  Negative for chest pain and  palpitations.  Gastrointestinal:  Negative for diarrhea, nausea and vomiting.  Endocrine: Negative for polydipsia and polyuria.  Genitourinary:  Negative for dysuria and hematuria.  Musculoskeletal:  Positive for arthralgias (Left wrist and hand) and back pain. Negative for neck pain and neck stiffness.  Skin:  Negative for rash.  Neurological:  Negative for dizziness, numbness and headaches.  Psychiatric/Behavioral:  Negative for agitation and behavioral problems.       Objective:  Physical Exam Vitals reviewed.  Constitutional:      General: He is not in acute distress.    Appearance: He is not diaphoretic.  HENT:     Head: Normocephalic and atraumatic.     Nose: Nose normal.     Mouth/Throat:     Mouth: Mucous membranes are moist.  Eyes:     General: No scleral icterus.    Extraocular Movements: Extraocular movements intact.  Cardiovascular:     Rate and Rhythm: Normal rate and regular rhythm.     Heart sounds: Normal heart sounds. No murmur heard. Pulmonary:     Breath sounds: Normal breath sounds. No wheezing or rales.  Abdominal:     Palpations: Abdomen is soft.     Tenderness: There is no abdominal tenderness.  Musculoskeletal:     Cervical back: Neck supple. No tenderness.     Right lower leg: No edema.     Left lower leg: No edema.     Comments: CMC thumb brace in place on left hand  Feet:     Left foot:     Toenail Condition: Left toenails are ingrown.  Skin:    General: Skin is warm.     Findings: No rash.     Comments: Epidermal cyst over right upper abdominal wall area, about 2 cm in diameter  Neurological:     General: No focal deficit present.     Mental Status: He is alert and oriented to person, place, and time.     Cranial Nerves: No cranial nerve deficit.     Sensory: No sensory deficit.     Motor: No weakness.  Psychiatric:        Mood and Affect: Mood normal.        Behavior: Behavior normal.     BP 119/87   Pulse 60   Ht 5' 8 (1.727  m)   Wt 180 lb 9.6 oz (81.9 kg)   SpO2 100%   BMI 27.46 kg/m  Wt Readings from Last 3 Encounters:  07/08/24 180 lb 9.6 oz (81.9 kg)  06/09/24 178 lb (80.7 kg)  02/15/24 179 lb 11.2 oz (81.5 kg)    Lab Results  Component Value Date   TSH 2.080 06/19/2023   Lab Results  Component Value Date   WBC 7.7 06/19/2023   HGB 14.5 06/19/2023   HCT 43.3 06/19/2023   MCV 93 06/19/2023   PLT 278 06/19/2023   Lab Results  Component Value Date   NA 141 12/25/2023   K 4.9 12/25/2023   CO2 20 12/25/2023   GLUCOSE 78 12/25/2023   BUN 27 12/25/2023   CREATININE 1.61 (H) 12/25/2023   BILITOT 0.3 06/19/2023   ALKPHOS 67 06/19/2023   AST 33 06/19/2023   ALT 28 06/19/2023   PROT 7.6 06/19/2023   ALBUMIN 4.5 06/19/2023   CALCIUM  10.3 (H) 12/25/2023   ANIONGAP 10 02/18/2022   EGFR 46 (L) 12/25/2023   Lab Results  Component Value Date   CHOL 213 (H) 06/19/2023   Lab Results  Component Value Date   HDL 86 06/19/2023   Lab Results  Component Value Date   LDLCALC 109 (H) 06/19/2023   Lab Results  Component Value Date   TRIG 102 06/19/2023   Lab Results  Component Value Date   CHOLHDL 2.5 06/19/2023   Lab Results  Component Value Date   HGBA1C 5.9 (H) 12/25/2023      Assessment & Plan:   Problem List Items Addressed This  Visit       Cardiovascular and Mediastinum   Essential hypertension   BP Readings from Last 1 Encounters:  07/08/24 119/87   Well-controlled with Olmesartan  20 mg QD Counseled for compliance with the medications Advised DASH diet and moderate exercise/walking, at least 150 mins/week      Relevant Orders   TSH   CMP14+EGFR   CBC with Differential/Platelet     Musculoskeletal and Integument   Primary osteoarthritis of left hand   Has been evaluated by hand surgery Has CMC thumb brace - pain improved        Genitourinary   Stage 3a chronic kidney disease (HCC)   Last 2 BMPs showed elevated creatinine and decreasing GFR Had advised to  increase fluid intake Avoid nephrotoxic agents, including NSAIDs Needs to avoid ibuprofen and take Tylenol  arthritis instead for back pain Check CMP and CBC If persistent decline, will refer to nephrology      Relevant Orders   CMP14+EGFR   CBC with Differential/Platelet     Other   Mixed hyperlipidemia   Continue Crestor  Check lipid profile      Relevant Orders   Lipid panel   Encounter for general adult medical examination with abnormal findings - Primary   Physical exam as documented. Fasting blood tests ordered. Denies flu vaccine. Advised to get Shingrix vaccine at local pharmacy.      Prostate cancer screening   Ordered PSA after discussing its limitations for prostate cancer screening, including false positive results leading to additional investigations.      Relevant Orders   PSA   Prediabetes   Lab Results  Component Value Date   HGBA1C 5.9 (H) 12/25/2023   Advised to follow DASH diet      Relevant Orders   Hemoglobin A1c   CMP14+EGFR   Other Visit Diagnoses       Vitamin D  deficiency       Relevant Orders   VITAMIN D  25 Hydroxy (Vit-D Deficiency, Fractures)       No orders of the defined types were placed in this encounter.   Follow-up: Return in about 6 months (around 01/05/2025) for HTN.    Suzzane MARLA Blanch, MD

## 2024-07-08 NOTE — Assessment & Plan Note (Signed)
Physical exam as documented. Fasting blood tests ordered. Denies flu vaccine. Advised to get Shingrix vaccine at local pharmacy.

## 2024-07-08 NOTE — Assessment & Plan Note (Addendum)
Continue Crestor Check lipid profile 

## 2024-07-08 NOTE — Assessment & Plan Note (Signed)
 BP Readings from Last 1 Encounters:  07/08/24 119/87   Well-controlled with Olmesartan  20 mg QD Counseled for compliance with the medications Advised DASH diet and moderate exercise/walking, at least 150 mins/week

## 2024-07-08 NOTE — Assessment & Plan Note (Signed)
Has been evaluated by hand surgery Has [facility] thumb brace - pain improved

## 2024-07-09 LAB — LIPID PANEL
Chol/HDL Ratio: 2.6 ratio (ref 0.0–5.0)
Cholesterol, Total: 207 mg/dL — ABNORMAL HIGH (ref 100–199)
HDL: 80 mg/dL (ref >39)
LDL Chol Calc (NIH): 95 mg/dL (ref 0–99)
Triglycerides: 189 mg/dL — ABNORMAL HIGH (ref 0–149)
VLDL Cholesterol Cal: 32 mg/dL (ref 5–40)

## 2024-07-09 LAB — CBC WITH DIFFERENTIAL/PLATELET
Basophils Absolute: 0.1 x10E3/uL (ref 0.0–0.2)
Basos: 1 %
EOS (ABSOLUTE): 0.1 x10E3/uL (ref 0.0–0.4)
Eos: 2 %
Hematocrit: 45 % (ref 37.5–51.0)
Hemoglobin: 14.5 g/dL (ref 13.0–17.7)
Immature Grans (Abs): 0 x10E3/uL (ref 0.0–0.1)
Immature Granulocytes: 0 %
Lymphocytes Absolute: 2.3 x10E3/uL (ref 0.7–3.1)
Lymphs: 30 %
MCH: 31.4 pg (ref 26.6–33.0)
MCHC: 32.2 g/dL (ref 31.5–35.7)
MCV: 97 fL (ref 79–97)
Monocytes Absolute: 0.9 x10E3/uL (ref 0.1–0.9)
Monocytes: 12 %
Neutrophils Absolute: 4.3 x10E3/uL (ref 1.4–7.0)
Neutrophils: 55 %
Platelets: 253 x10E3/uL (ref 150–450)
RBC: 4.62 x10E6/uL (ref 4.14–5.80)
RDW: 13.5 % (ref 11.6–15.4)
WBC: 7.8 x10E3/uL (ref 3.4–10.8)

## 2024-07-09 LAB — CMP14+EGFR
ALT: 31 IU/L (ref 0–44)
AST: 38 IU/L (ref 0–40)
Albumin: 4.5 g/dL (ref 3.9–4.9)
Alkaline Phosphatase: 57 IU/L (ref 47–123)
BUN/Creatinine Ratio: 21 (ref 10–24)
BUN: 29 mg/dL — ABNORMAL HIGH (ref 8–27)
Bilirubin Total: 0.4 mg/dL (ref 0.0–1.2)
CO2: 23 mmol/L (ref 20–29)
Calcium: 9.9 mg/dL (ref 8.6–10.2)
Chloride: 102 mmol/L (ref 96–106)
Creatinine, Ser: 1.38 mg/dL — ABNORMAL HIGH (ref 0.76–1.27)
Globulin, Total: 3.1 g/dL (ref 1.5–4.5)
Glucose: 86 mg/dL (ref 70–99)
Potassium: 5.3 mmol/L — ABNORMAL HIGH (ref 3.5–5.2)
Sodium: 137 mmol/L (ref 134–144)
Total Protein: 7.6 g/dL (ref 6.0–8.5)
eGFR: 55 mL/min/1.73 — ABNORMAL LOW (ref >59)

## 2024-07-09 LAB — TSH: TSH: 1.62 u[IU]/mL (ref 0.450–4.500)

## 2024-07-09 LAB — PSA: Prostate Specific Ag, Serum: 3.4 ng/mL (ref 0.0–4.0)

## 2024-07-09 LAB — HEMOGLOBIN A1C
Est. average glucose Bld gHb Est-mCnc: 123 mg/dL
Hgb A1c MFr Bld: 5.9 % — ABNORMAL HIGH (ref 4.8–5.6)

## 2024-07-09 LAB — VITAMIN D 25 HYDROXY (VIT D DEFICIENCY, FRACTURES): Vit D, 25-Hydroxy: 43.8 ng/mL (ref 30.0–100.0)

## 2024-07-11 ENCOUNTER — Ambulatory Visit: Payer: Self-pay | Admitting: Internal Medicine

## 2024-08-31 ENCOUNTER — Ambulatory Visit: Payer: Medicare HMO

## 2024-08-31 VITALS — Ht 68.0 in | Wt 170.0 lb

## 2024-08-31 DIAGNOSIS — Z0000 Encounter for general adult medical examination without abnormal findings: Secondary | ICD-10-CM

## 2024-08-31 DIAGNOSIS — Z2821 Immunization not carried out because of patient refusal: Secondary | ICD-10-CM

## 2024-08-31 NOTE — Patient Instructions (Signed)
 Kyle Jarvis,  Thank you for taking the time for your Medicare Wellness Visit. I appreciate your continued commitment to your health goals. Please review the care plan we discussed, and feel free to reach out if I can assist you further.  Please note that Annual Wellness Visits do not include a physical exam. Some assessments may be limited, especially if the visit was conducted virtually. If needed, we may recommend an in-person follow-up with your provider.  Ongoing Care Seeing your primary care provider every 3 to 6 months helps us  monitor your health and provide consistent, personalized care.   1 year follow up for Medicare well visit: Tuesday September 05, 2025 at 8:40 am with medicare wellness nurse in office  Recommended Screenings:  Health Maintenance  Topic Date Due   Zoster (Shingles) Vaccine (2 of 2) 08/12/2022   COVID-19 Vaccine (1 - 2025-26 season) Never done   Medicare Annual Wellness Visit  08/30/2024   Flu Shot  11/29/2024*   Colon Cancer Screening  05/01/2027   DTaP/Tdap/Td vaccine (2 - Td or Tdap) 06/17/2032   Pneumococcal Vaccine for age over 30  Completed   Hepatitis C Screening  Completed   Meningitis B Vaccine  Aged Out  *Topic was postponed. The date shown is not the original due date.       08/30/2024    9:57 AM  Advanced Directives  Does Patient Have a Medical Advance Directive? Yes  Type of Advance Directive Healthcare Power of Attorney  Copy of Healthcare Power of Attorney in Chart? Yes - validated most recent copy scanned in chart (See row information)    Vision: Annual vision screenings are recommended for early detection of glaucoma, cataracts, and diabetic retinopathy. These exams can also reveal signs of chronic conditions such as diabetes and high blood pressure.  Dental: Annual dental screenings help detect early signs of oral cancer, gum disease, and other conditions linked to overall health, including heart disease and diabetes.  Please see  the attached documents for additional preventive care recommendations.

## 2024-08-31 NOTE — Progress Notes (Signed)
 "  No voiced or noted concerns at this time. Chief Complaint  Patient presents with   Medicare Wellness     Subjective:   Kyle Jarvis is a 68 y.o. male who presents for a Medicare Annual Wellness Visit.  Visit info / Clinical Intake: Medicare Wellness Visit Type:: Subsequent Annual Wellness Visit Persons participating in visit and providing information:: patient Medicare Wellness Visit Mode:: Video Since this visit was completed virtually, some vitals may be partially provided or unavailable. Missing vitals are due to the limitations of the virtual format.: Documented vitals are patient reported If Telephone or Video please confirm:: I connected with patient using audio/video enable telemedicine. I verified patient identity with two identifiers, discussed telehealth limitations, and patient agreed to proceed. Patient Location:: home Provider Location:: office Interpreter Needed?: No Pre-visit prep was completed: yes AWV questionnaire completed by patient prior to visit?: yes Date:: 08/30/24 Living arrangements:: (Patient-Rptd) lives with spouse/significant other Patient's Overall Health Status Rating: (Patient-Rptd) very good Typical amount of pain: (Patient-Rptd) some Does pain affect daily life?: (Patient-Rptd) no Are you currently prescribed opioids?: no  Dietary Habits and Nutritional Risks How many meals a day?: (Patient-Rptd) 2 Eats fruit and vegetables daily?: (Patient-Rptd) yes Most meals are obtained by: (Patient-Rptd) preparing own meals In the last 2 weeks, have you had any of the following?: none Diabetic:: no  Functional Status Activities of Daily Living (to include ambulation/medication): (Patient-Rptd) Independent Ambulation: (Patient-Rptd) Independent Medication Administration: (Patient-Rptd) Independent Home Management (perform basic housework or laundry): (Patient-Rptd) Independent Manage your own finances?: (Patient-Rptd) yes Primary transportation  is: (Patient-Rptd) driving Concerns about vision?: no *vision screening is required for WTM* Concerns about hearing?: no  Fall Screening Falls in the past year?: (Patient-Rptd) 0 Number of falls in past year: 0 Was there an injury with Fall?: 0 Fall Risk Category Calculator: 0 Patient Fall Risk Level: Low Fall Risk  Fall Risk Patient at Risk for Falls Due to: No Fall Risks Fall risk Follow up: Falls evaluation completed; Education provided; Falls prevention discussed  Home and Transportation Safety: All rugs have non-skid backing?: (Patient-Rptd) yes All stairs or steps have railings?: (Patient-Rptd) N/A, no stairs Grab bars in the bathtub or shower?: (!) (Patient-Rptd) no Have non-skid surface in bathtub or shower?: (Patient-Rptd) yes Good home lighting?: (Patient-Rptd) yes Regular seat belt use?: (Patient-Rptd) yes Hospital stays in the last year:: (Patient-Rptd) no  Cognitive Assessment Difficulty concentrating, remembering, or making decisions? : (Patient-Rptd) no Will 6CIT or Mini Cog be Completed: yes What year is it?: 0 points What month is it?: 0 points Give patient an address phrase to remember (5 components): [address] [place] About what time is it?: 0 points Count backwards from 20 to 1: 0 points Say the months of the year in reverse: 0 points Repeat the address phrase from earlier: 0 points 6 CIT Score: 0 points  Advance Directives (For Healthcare) Does Patient Have a Medical Advance Directive?: Yes Type of Advance Directive: Healthcare Power of Attorney Copy of Healthcare Power of Attorney in Chart?: Yes - validated most recent copy scanned in chart (See row information)  Reviewed/Updated  Reviewed/Updated: Reviewed All (Medical, Surgical, Family, Medications, Allergies, Care Teams, Patient Goals)    Allergies (verified) Bee venom, Hydrocortisone, and Pollen extract   Current Medications (verified) Outpatient Encounter Medications as of 08/31/2024   Medication Sig   albuterol  (VENTOLIN  HFA) 108 (90 Base) MCG/ACT inhaler Inhale 2 puffs into the lungs every 4 (four) hours as needed for wheezing or shortness of  breath.   cholecalciferol  (VITAMIN D3) 25 MCG (1000 UNIT) tablet Take 1,000 Units by mouth daily.   COLLAGEN PO Take 2 capsules by mouth daily.   EPINEPHrine  0.3 mg/0.3 mL IJ SOAJ injection Inject 0.3 mg into the muscle as needed for anaphylaxis.   Multiple Minerals (CALCIUM -MAGNESIUM -ZINC ) TABS Take 1 tablet by mouth daily.   olmesartan  (BENICAR ) 20 MG tablet TAKE 1 TABLET BY MOUTH EVERY DAY   ORAL ELECTROLYTES PO Take 1 capsule by mouth daily.   rosuvastatin  (CRESTOR ) 40 MG tablet TAKE 1 TABLET BY MOUTH EVERY DAY   sildenafil  (VIAGRA ) 100 MG tablet Take 1 tablet (100 mg total) by mouth daily as needed for erectile dysfunction.   No facility-administered encounter medications on file as of 08/31/2024.    History: Past Medical History:  Diagnosis Date   Allergy    COVID-19 04/26/2021   Farmer's lung (HCC)    GERD (gastroesophageal reflux disease)    Hypertension    Past Surgical History:  Procedure Laterality Date   CHOLECYSTECTOMY     FRACTURE SURGERY Right    hand surgery- in [place]    HERNIA REPAIR Bilateral    inguinal   SPLENECTOMY     TONSILLECTOMY     out at age 89   TRANSFORAMINAL LUMBAR INTERBODY FUSION W/ MIS 2 LEVEL N/A 02/25/2022   Procedure: LATERAL LUMBAR INTERBODY FUSION, LEFT  LUMBAR THREE-FOUR, LUMBAR FOUR-FIVE, PRONE TRANSPSOAS, PERCUTANEOUS PEDICLE SCREW FIXATION;  Surgeon: Debby Dorn MATSU, MD;  Location: [facility] OR;  Service: Neurosurgery;  Laterality: N/A;   Family History  Problem Relation Age of Onset   Stroke Mother    Cancer Father    Heart disease Father    Alcohol abuse Maternal Uncle    Alcohol abuse Maternal Uncle    Cancer Paternal Uncle    Social History   Occupational History   Occupation: Retired    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, maintenance, education officer, museum  Tobacco Use   Smoking  status: Former    Current packs/day: 0.00    Average packs/day: 1 pack/day for 9.0 years (9.0 ttl pk-yrs)    Types: Cigarettes    Start date: 3    Quit date: 2000    Years since quitting: 26.0   Smokeless tobacco: Former    Types: Chew    Quit date: 05/15/2016  Vaping Use   Vaping status: Never Used  Substance and Sexual Activity   Alcohol use: Yes    Alcohol/week: 28.0 standard drinks of alcohol    Types: 14 Cans of beer, 14 Shots of liquor per week    Comment: trying to cut back   Drug use: Not Currently   Sexual activity: Not Currently    Comment: erectile dysfunction- since hernia surgery   Tobacco Counseling Counseling given: Yes  SDOH Screenings   Food Insecurity: No Food Insecurity (08/31/2024)  Housing: Low Risk (08/31/2024)  Transportation Needs: No Transportation Needs (08/31/2024)  Utilities: Not At Risk (08/31/2024)  Alcohol Screen: Low Risk (07/01/2024)  Depression (PHQ2-9): Low Risk (08/31/2024)  Financial Resource Strain: Low Risk (07/01/2024)  Physical Activity: Sufficiently Active (08/31/2024)  Social Connections: Socially Integrated (08/31/2024)  Stress: No Stress Concern Present (08/31/2024)  Tobacco Use: Medium Risk (08/31/2024)  Health Literacy: Adequate Health Literacy (08/31/2024)   See flowsheets for full screening details  Depression Screen PHQ 2 & 9 Depression Scale- Over the past 2 weeks, how often have you been bothered by any of the following problems? Little interest or pleasure in doing things: 0 Feeling down, depressed,  or hopeless (PHQ Adolescent also includes...irritable): 0 PHQ-2 Total Score: 0 Trouble falling or staying asleep, or sleeping too much: 0 Feeling tired or having little energy: 0 Poor appetite or overeating (PHQ Adolescent also includes...weight loss): 0 Feeling bad about yourself - or that you are a failure or have let yourself or your family down: 0 Trouble concentrating on things, such as reading the newspaper or  watching television (PHQ Adolescent also includes...like school work): 0 Moving or speaking so slowly that other people could have noticed. Or the opposite - being so fidgety or restless that you have been moving around a lot more than usual: 0 Thoughts that you would be better off dead, or of hurting yourself in some way: 0 PHQ-9 Total Score: 0 If you checked off any problems, how difficult have these problems made it for you to do your work, take care of things at home, or get along with other people?: Not difficult at all     Goals Addressed               This Visit's Progress     Stay active and healthy (pt-stated)               Objective:    Today's Vitals   08/31/24 1049  Weight: 170 lb (77.1 kg)  Height: 5' 8 (1.727 m)   Body mass index is 25.85 kg/m.  Hearing/Vision screen Hearing Screening - Comments:: Patient wears hearing aids. Sees [facility] in [place]   Vision Screening - Comments:: Wears rx glasses - up to date with routine eye exams with  [facility] Immunizations and Health Maintenance Health Maintenance  Topic Date Due   Zoster Vaccines- Shingrix (2 of 2) 08/12/2022   COVID-19 Vaccine (1 - 2025-26 season) Never done   Medicare Annual Wellness (AWV)  08/30/2024   Influenza Vaccine  11/29/2024 (Originally 04/01/2024)   Colonoscopy  05/01/2027   DTaP/Tdap/Td (2 - Td or Tdap) 06/17/2032   Pneumococcal Vaccine: 50+ Years  Completed   Hepatitis C Screening  Completed   Meningococcal B Vaccine  Aged Out        Assessment/Plan:  This is a routine wellness examination for [place].  Patient Care Team: Tobie Suzzane POUR, MD as PCP - General (Internal Medicine) [facility] (Optometry) Shlomo Fallow, OD [facility])  I have personally reviewed and noted the following in the patients chart:   Medical and social history Use of alcohol, tobacco or illicit drugs  Current medications and supplements including opioid  prescriptions. Functional ability and status Nutritional status Physical activity Advanced directives List of other physicians Hospitalizations, surgeries, and ER visits in previous 12 months Vitals Screenings to include cognitive, depression, and falls Referrals and appointments  No orders of the defined types were placed in this encounter.  In addition, I have reviewed and discussed with patient certain preventive protocols, quality metrics, and best practice recommendations. A written personalized care plan for preventive services as well as general preventive health recommendations were provided to patient.   Darryll Raju, CMA   08/31/2024   Return for Medicare Wellness Visit Tuesday September 05, 2025 at 8:40 am in the office.  After Visit Summary: (MyChart) Due to this being a telephonic visit, the after visit summary with patients personalized plan was offered to patient via MyChart    "

## 2024-10-06 ENCOUNTER — Other Ambulatory Visit: Payer: Self-pay | Admitting: Internal Medicine

## 2024-10-06 DIAGNOSIS — I10 Essential (primary) hypertension: Secondary | ICD-10-CM

## 2024-10-06 DIAGNOSIS — E7800 Pure hypercholesterolemia, unspecified: Secondary | ICD-10-CM

## 2025-01-05 ENCOUNTER — Ambulatory Visit: Admitting: Internal Medicine

## 2025-09-05 ENCOUNTER — Ambulatory Visit: Payer: Self-pay
